# Patient Record
Sex: Male | Born: 1950
Health system: Southern US, Community
[De-identification: ages and names within clinical notes are randomized; demographics above are authoritative.]

## PROBLEM LIST (undated history)

## (undated) DIAGNOSIS — E119 Type 2 diabetes mellitus without complications: Secondary | ICD-10-CM

## (undated) DIAGNOSIS — R002 Palpitations: Secondary | ICD-10-CM

## (undated) DIAGNOSIS — E785 Hyperlipidemia, unspecified: Secondary | ICD-10-CM

## (undated) DIAGNOSIS — G4733 Obstructive sleep apnea (adult) (pediatric): Secondary | ICD-10-CM

## (undated) DIAGNOSIS — K219 Gastro-esophageal reflux disease without esophagitis: Secondary | ICD-10-CM

## (undated) DIAGNOSIS — K5790 Diverticulosis of intestine, part unspecified, without perforation or abscess without bleeding: Secondary | ICD-10-CM

## (undated) DIAGNOSIS — M109 Gout, unspecified: Secondary | ICD-10-CM

## (undated) DIAGNOSIS — C44721 Squamous cell carcinoma of skin of unspecified lower limb, including hip: Secondary | ICD-10-CM

## (undated) DIAGNOSIS — N189 Chronic kidney disease, unspecified: Secondary | ICD-10-CM

## (undated) DIAGNOSIS — I10 Essential (primary) hypertension: Secondary | ICD-10-CM

## (undated) HISTORY — DX: Type 2 diabetes mellitus without complications: E11.9

## (undated) HISTORY — DX: Obstructive sleep apnea (adult) (pediatric): G47.33

## (undated) HISTORY — DX: Palpitations: R00.2

## (undated) HISTORY — DX: Essential (primary) hypertension: I10

## (undated) HISTORY — DX: Gastro-esophageal reflux disease without esophagitis: K21.9

## (undated) HISTORY — DX: Squamous cell carcinoma of skin of unspecified lower limb, including hip: C44.721

## (undated) HISTORY — PX: TONSILLECTOMY: SUR1361

## (undated) HISTORY — DX: Diverticulosis of intestine, part unspecified, without perforation or abscess without bleeding: K57.90

## (undated) HISTORY — DX: Hyperlipidemia, unspecified: E78.5

## (undated) HISTORY — PX: APPENDECTOMY: SHX54

## (undated) HISTORY — PX: KNEE SURGERY: SHX244

## (undated) HISTORY — DX: Chronic kidney disease, unspecified: N18.9

---

## 2000-02-29 ENCOUNTER — Encounter: Payer: Self-pay | Admitting: Family Medicine

## 2000-02-29 ENCOUNTER — Encounter: Admission: RE | Admit: 2000-02-29 | Discharge: 2000-02-29 | Payer: Self-pay | Admitting: Family Medicine

## 2000-05-17 ENCOUNTER — Other Ambulatory Visit: Admission: RE | Admit: 2000-05-17 | Discharge: 2000-05-17 | Payer: Self-pay | Admitting: Internal Medicine

## 2001-06-28 ENCOUNTER — Encounter: Payer: Self-pay | Admitting: Family Medicine

## 2001-06-28 ENCOUNTER — Encounter: Admission: RE | Admit: 2001-06-28 | Discharge: 2001-06-28 | Payer: Self-pay | Admitting: Family Medicine

## 2003-01-15 ENCOUNTER — Encounter: Admission: RE | Admit: 2003-01-15 | Discharge: 2003-01-15 | Payer: Self-pay | Admitting: Family Medicine

## 2003-01-15 ENCOUNTER — Encounter: Payer: Self-pay | Admitting: Family Medicine

## 2015-11-01 DIAGNOSIS — N183 Chronic kidney disease, stage 3 (moderate): Secondary | ICD-10-CM | POA: Diagnosis not present

## 2015-11-01 DIAGNOSIS — E1122 Type 2 diabetes mellitus with diabetic chronic kidney disease: Secondary | ICD-10-CM | POA: Diagnosis not present

## 2015-11-01 DIAGNOSIS — Z794 Long term (current) use of insulin: Secondary | ICD-10-CM | POA: Diagnosis not present

## 2015-11-01 DIAGNOSIS — I10 Essential (primary) hypertension: Secondary | ICD-10-CM | POA: Diagnosis not present

## 2015-11-01 DIAGNOSIS — E785 Hyperlipidemia, unspecified: Secondary | ICD-10-CM | POA: Diagnosis not present

## 2015-11-18 DIAGNOSIS — M2040 Other hammer toe(s) (acquired), unspecified foot: Secondary | ICD-10-CM | POA: Diagnosis not present

## 2015-11-18 DIAGNOSIS — E1129 Type 2 diabetes mellitus with other diabetic kidney complication: Secondary | ICD-10-CM | POA: Diagnosis not present

## 2015-11-18 DIAGNOSIS — M7672 Peroneal tendinitis, left leg: Secondary | ICD-10-CM | POA: Diagnosis not present

## 2015-11-29 DIAGNOSIS — N183 Chronic kidney disease, stage 3 (moderate): Secondary | ICD-10-CM | POA: Diagnosis not present

## 2015-11-29 DIAGNOSIS — K219 Gastro-esophageal reflux disease without esophagitis: Secondary | ICD-10-CM | POA: Diagnosis not present

## 2015-11-29 DIAGNOSIS — I10 Essential (primary) hypertension: Secondary | ICD-10-CM | POA: Diagnosis not present

## 2015-11-29 DIAGNOSIS — R1314 Dysphagia, pharyngoesophageal phase: Secondary | ICD-10-CM | POA: Diagnosis not present

## 2015-11-29 DIAGNOSIS — E7800 Pure hypercholesterolemia, unspecified: Secondary | ICD-10-CM | POA: Insufficient documentation

## 2015-11-29 DIAGNOSIS — Z794 Long term (current) use of insulin: Secondary | ICD-10-CM | POA: Diagnosis not present

## 2015-11-29 DIAGNOSIS — E559 Vitamin D deficiency, unspecified: Secondary | ICD-10-CM | POA: Diagnosis not present

## 2015-11-29 DIAGNOSIS — E119 Type 2 diabetes mellitus without complications: Secondary | ICD-10-CM | POA: Diagnosis not present

## 2015-12-12 DIAGNOSIS — Z86010 Personal history of colonic polyps: Secondary | ICD-10-CM | POA: Insufficient documentation

## 2015-12-14 DIAGNOSIS — K219 Gastro-esophageal reflux disease without esophagitis: Secondary | ICD-10-CM | POA: Diagnosis not present

## 2015-12-14 DIAGNOSIS — Z86010 Personal history of colonic polyps: Secondary | ICD-10-CM | POA: Diagnosis not present

## 2015-12-14 DIAGNOSIS — R1314 Dysphagia, pharyngoesophageal phase: Secondary | ICD-10-CM | POA: Diagnosis not present

## 2015-12-30 DIAGNOSIS — R1314 Dysphagia, pharyngoesophageal phase: Secondary | ICD-10-CM | POA: Diagnosis not present

## 2015-12-30 DIAGNOSIS — K219 Gastro-esophageal reflux disease without esophagitis: Secondary | ICD-10-CM | POA: Diagnosis not present

## 2015-12-30 DIAGNOSIS — K209 Esophagitis, unspecified: Secondary | ICD-10-CM | POA: Diagnosis not present

## 2015-12-30 DIAGNOSIS — K2210 Ulcer of esophagus without bleeding: Secondary | ICD-10-CM | POA: Diagnosis not present

## 2015-12-30 DIAGNOSIS — K210 Gastro-esophageal reflux disease with esophagitis: Secondary | ICD-10-CM | POA: Diagnosis not present

## 2016-01-24 DIAGNOSIS — M7751 Other enthesopathy of right foot: Secondary | ICD-10-CM | POA: Diagnosis not present

## 2016-01-24 DIAGNOSIS — M7661 Achilles tendinitis, right leg: Secondary | ICD-10-CM | POA: Insufficient documentation

## 2016-01-24 DIAGNOSIS — M722 Plantar fascial fibromatosis: Secondary | ICD-10-CM | POA: Insufficient documentation

## 2016-01-24 DIAGNOSIS — M6701 Short Achilles tendon (acquired), right ankle: Secondary | ICD-10-CM | POA: Diagnosis not present

## 2016-02-08 DIAGNOSIS — M7751 Other enthesopathy of right foot: Secondary | ICD-10-CM | POA: Diagnosis not present

## 2016-02-08 DIAGNOSIS — M6701 Short Achilles tendon (acquired), right ankle: Secondary | ICD-10-CM | POA: Diagnosis not present

## 2016-02-08 DIAGNOSIS — M7661 Achilles tendinitis, right leg: Secondary | ICD-10-CM | POA: Diagnosis not present

## 2016-03-03 DIAGNOSIS — Z136 Encounter for screening for cardiovascular disorders: Secondary | ICD-10-CM | POA: Diagnosis not present

## 2016-03-03 DIAGNOSIS — Z23 Encounter for immunization: Secondary | ICD-10-CM | POA: Diagnosis not present

## 2016-03-03 DIAGNOSIS — E7800 Pure hypercholesterolemia, unspecified: Secondary | ICD-10-CM | POA: Diagnosis not present

## 2016-03-03 DIAGNOSIS — Z794 Long term (current) use of insulin: Secondary | ICD-10-CM | POA: Diagnosis not present

## 2016-03-03 DIAGNOSIS — E119 Type 2 diabetes mellitus without complications: Secondary | ICD-10-CM | POA: Diagnosis not present

## 2016-03-03 DIAGNOSIS — I10 Essential (primary) hypertension: Secondary | ICD-10-CM | POA: Diagnosis not present

## 2016-03-03 DIAGNOSIS — Z1159 Encounter for screening for other viral diseases: Secondary | ICD-10-CM | POA: Diagnosis not present

## 2016-03-17 DIAGNOSIS — K253 Acute gastric ulcer without hemorrhage or perforation: Secondary | ICD-10-CM | POA: Diagnosis not present

## 2016-03-17 DIAGNOSIS — K209 Esophagitis, unspecified: Secondary | ICD-10-CM | POA: Diagnosis not present

## 2016-03-17 DIAGNOSIS — R1310 Dysphagia, unspecified: Secondary | ICD-10-CM | POA: Diagnosis not present

## 2016-03-24 DIAGNOSIS — I129 Hypertensive chronic kidney disease with stage 1 through stage 4 chronic kidney disease, or unspecified chronic kidney disease: Secondary | ICD-10-CM | POA: Diagnosis not present

## 2016-03-24 DIAGNOSIS — N183 Chronic kidney disease, stage 3 unspecified: Secondary | ICD-10-CM | POA: Insufficient documentation

## 2016-03-24 DIAGNOSIS — E1165 Type 2 diabetes mellitus with hyperglycemia: Secondary | ICD-10-CM | POA: Diagnosis not present

## 2016-03-24 DIAGNOSIS — Z794 Long term (current) use of insulin: Secondary | ICD-10-CM | POA: Insufficient documentation

## 2016-03-24 DIAGNOSIS — E785 Hyperlipidemia, unspecified: Secondary | ICD-10-CM | POA: Diagnosis not present

## 2016-03-24 DIAGNOSIS — E1122 Type 2 diabetes mellitus with diabetic chronic kidney disease: Secondary | ICD-10-CM | POA: Diagnosis not present

## 2016-03-28 DIAGNOSIS — E875 Hyperkalemia: Secondary | ICD-10-CM | POA: Diagnosis not present

## 2016-05-18 DIAGNOSIS — M2041 Other hammer toe(s) (acquired), right foot: Secondary | ICD-10-CM | POA: Diagnosis not present

## 2016-05-18 DIAGNOSIS — M2042 Other hammer toe(s) (acquired), left foot: Secondary | ICD-10-CM | POA: Diagnosis not present

## 2016-05-18 DIAGNOSIS — M2040 Other hammer toe(s) (acquired), unspecified foot: Secondary | ICD-10-CM | POA: Insufficient documentation

## 2016-05-18 DIAGNOSIS — M722 Plantar fascial fibromatosis: Secondary | ICD-10-CM | POA: Diagnosis not present

## 2016-05-18 DIAGNOSIS — M6701 Short Achilles tendon (acquired), right ankle: Secondary | ICD-10-CM | POA: Diagnosis not present

## 2016-07-14 DIAGNOSIS — H5211 Myopia, right eye: Secondary | ICD-10-CM | POA: Diagnosis not present

## 2016-07-14 DIAGNOSIS — H35033 Hypertensive retinopathy, bilateral: Secondary | ICD-10-CM | POA: Diagnosis not present

## 2016-07-14 DIAGNOSIS — E119 Type 2 diabetes mellitus without complications: Secondary | ICD-10-CM | POA: Diagnosis not present

## 2016-07-14 DIAGNOSIS — H524 Presbyopia: Secondary | ICD-10-CM | POA: Diagnosis not present

## 2016-07-17 DIAGNOSIS — E11649 Type 2 diabetes mellitus with hypoglycemia without coma: Secondary | ICD-10-CM | POA: Diagnosis not present

## 2016-07-17 DIAGNOSIS — I129 Hypertensive chronic kidney disease with stage 1 through stage 4 chronic kidney disease, or unspecified chronic kidney disease: Secondary | ICD-10-CM | POA: Diagnosis not present

## 2016-07-17 DIAGNOSIS — Z794 Long term (current) use of insulin: Secondary | ICD-10-CM | POA: Diagnosis not present

## 2016-07-17 DIAGNOSIS — E1165 Type 2 diabetes mellitus with hyperglycemia: Secondary | ICD-10-CM | POA: Diagnosis not present

## 2016-07-17 DIAGNOSIS — E785 Hyperlipidemia, unspecified: Secondary | ICD-10-CM | POA: Diagnosis not present

## 2016-07-17 DIAGNOSIS — N183 Chronic kidney disease, stage 3 (moderate): Secondary | ICD-10-CM | POA: Diagnosis not present

## 2016-07-17 DIAGNOSIS — E1122 Type 2 diabetes mellitus with diabetic chronic kidney disease: Secondary | ICD-10-CM | POA: Diagnosis not present

## 2016-09-04 DIAGNOSIS — E7800 Pure hypercholesterolemia, unspecified: Secondary | ICD-10-CM | POA: Diagnosis not present

## 2016-09-04 DIAGNOSIS — N183 Chronic kidney disease, stage 3 (moderate): Secondary | ICD-10-CM | POA: Diagnosis not present

## 2016-09-04 DIAGNOSIS — I10 Essential (primary) hypertension: Secondary | ICD-10-CM | POA: Diagnosis not present

## 2016-09-04 DIAGNOSIS — E119 Type 2 diabetes mellitus without complications: Secondary | ICD-10-CM | POA: Diagnosis not present

## 2016-09-04 DIAGNOSIS — Z794 Long term (current) use of insulin: Secondary | ICD-10-CM | POA: Diagnosis not present

## 2016-11-20 DIAGNOSIS — N183 Chronic kidney disease, stage 3 (moderate): Secondary | ICD-10-CM | POA: Diagnosis not present

## 2016-11-20 DIAGNOSIS — Z794 Long term (current) use of insulin: Secondary | ICD-10-CM | POA: Diagnosis not present

## 2016-11-20 DIAGNOSIS — I129 Hypertensive chronic kidney disease with stage 1 through stage 4 chronic kidney disease, or unspecified chronic kidney disease: Secondary | ICD-10-CM | POA: Diagnosis not present

## 2016-11-20 DIAGNOSIS — E785 Hyperlipidemia, unspecified: Secondary | ICD-10-CM | POA: Diagnosis not present

## 2016-11-20 DIAGNOSIS — E1122 Type 2 diabetes mellitus with diabetic chronic kidney disease: Secondary | ICD-10-CM | POA: Diagnosis not present

## 2016-11-23 DIAGNOSIS — M2042 Other hammer toe(s) (acquired), left foot: Secondary | ICD-10-CM | POA: Diagnosis not present

## 2016-11-23 DIAGNOSIS — N183 Chronic kidney disease, stage 3 (moderate): Secondary | ICD-10-CM | POA: Diagnosis not present

## 2016-11-23 DIAGNOSIS — M2041 Other hammer toe(s) (acquired), right foot: Secondary | ICD-10-CM | POA: Diagnosis not present

## 2016-11-23 DIAGNOSIS — E1122 Type 2 diabetes mellitus with diabetic chronic kidney disease: Secondary | ICD-10-CM | POA: Diagnosis not present

## 2016-11-23 DIAGNOSIS — M6701 Short Achilles tendon (acquired), right ankle: Secondary | ICD-10-CM | POA: Diagnosis not present

## 2017-03-07 DIAGNOSIS — E7800 Pure hypercholesterolemia, unspecified: Secondary | ICD-10-CM | POA: Diagnosis not present

## 2017-03-07 DIAGNOSIS — I10 Essential (primary) hypertension: Secondary | ICD-10-CM | POA: Diagnosis not present

## 2017-03-07 DIAGNOSIS — K219 Gastro-esophageal reflux disease without esophagitis: Secondary | ICD-10-CM | POA: Diagnosis not present

## 2017-03-07 DIAGNOSIS — E119 Type 2 diabetes mellitus without complications: Secondary | ICD-10-CM | POA: Diagnosis not present

## 2017-03-07 DIAGNOSIS — N183 Chronic kidney disease, stage 3 (moderate): Secondary | ICD-10-CM | POA: Diagnosis not present

## 2017-03-07 DIAGNOSIS — Z136 Encounter for screening for cardiovascular disorders: Secondary | ICD-10-CM | POA: Diagnosis not present

## 2017-03-07 DIAGNOSIS — Z794 Long term (current) use of insulin: Secondary | ICD-10-CM | POA: Diagnosis not present

## 2017-03-07 DIAGNOSIS — L729 Follicular cyst of the skin and subcutaneous tissue, unspecified: Secondary | ICD-10-CM | POA: Diagnosis not present

## 2017-03-09 DIAGNOSIS — Z136 Encounter for screening for cardiovascular disorders: Secondary | ICD-10-CM | POA: Diagnosis not present

## 2017-03-09 DIAGNOSIS — I712 Thoracic aortic aneurysm, without rupture: Secondary | ICD-10-CM | POA: Diagnosis not present

## 2017-03-23 DIAGNOSIS — E785 Hyperlipidemia, unspecified: Secondary | ICD-10-CM | POA: Diagnosis not present

## 2017-03-23 DIAGNOSIS — N183 Chronic kidney disease, stage 3 (moderate): Secondary | ICD-10-CM | POA: Diagnosis not present

## 2017-03-23 DIAGNOSIS — I129 Hypertensive chronic kidney disease with stage 1 through stage 4 chronic kidney disease, or unspecified chronic kidney disease: Secondary | ICD-10-CM | POA: Diagnosis not present

## 2017-03-23 DIAGNOSIS — Z794 Long term (current) use of insulin: Secondary | ICD-10-CM | POA: Diagnosis not present

## 2017-03-23 DIAGNOSIS — E1122 Type 2 diabetes mellitus with diabetic chronic kidney disease: Secondary | ICD-10-CM | POA: Diagnosis not present

## 2017-03-30 DIAGNOSIS — Z1212 Encounter for screening for malignant neoplasm of rectum: Secondary | ICD-10-CM | POA: Diagnosis not present

## 2017-03-30 DIAGNOSIS — Z1211 Encounter for screening for malignant neoplasm of colon: Secondary | ICD-10-CM | POA: Diagnosis not present

## 2017-04-20 DIAGNOSIS — L720 Epidermal cyst: Secondary | ICD-10-CM | POA: Diagnosis not present

## 2017-06-04 DIAGNOSIS — N183 Chronic kidney disease, stage 3 (moderate): Secondary | ICD-10-CM | POA: Diagnosis not present

## 2017-06-04 DIAGNOSIS — E1122 Type 2 diabetes mellitus with diabetic chronic kidney disease: Secondary | ICD-10-CM | POA: Diagnosis not present

## 2017-06-04 DIAGNOSIS — H60393 Other infective otitis externa, bilateral: Secondary | ICD-10-CM | POA: Diagnosis not present

## 2017-06-04 DIAGNOSIS — I10 Essential (primary) hypertension: Secondary | ICD-10-CM | POA: Diagnosis not present

## 2017-06-04 DIAGNOSIS — Z794 Long term (current) use of insulin: Secondary | ICD-10-CM | POA: Diagnosis not present

## 2017-07-24 DIAGNOSIS — N183 Chronic kidney disease, stage 3 (moderate): Secondary | ICD-10-CM | POA: Diagnosis not present

## 2017-07-24 DIAGNOSIS — I129 Hypertensive chronic kidney disease with stage 1 through stage 4 chronic kidney disease, or unspecified chronic kidney disease: Secondary | ICD-10-CM | POA: Diagnosis not present

## 2017-07-24 DIAGNOSIS — E785 Hyperlipidemia, unspecified: Secondary | ICD-10-CM | POA: Diagnosis not present

## 2017-07-24 DIAGNOSIS — E1122 Type 2 diabetes mellitus with diabetic chronic kidney disease: Secondary | ICD-10-CM | POA: Diagnosis not present

## 2017-08-28 DIAGNOSIS — N183 Chronic kidney disease, stage 3 (moderate): Secondary | ICD-10-CM | POA: Diagnosis not present

## 2017-08-28 DIAGNOSIS — E7800 Pure hypercholesterolemia, unspecified: Secondary | ICD-10-CM | POA: Diagnosis not present

## 2017-08-28 DIAGNOSIS — Z794 Long term (current) use of insulin: Secondary | ICD-10-CM | POA: Diagnosis not present

## 2017-08-28 DIAGNOSIS — I129 Hypertensive chronic kidney disease with stage 1 through stage 4 chronic kidney disease, or unspecified chronic kidney disease: Secondary | ICD-10-CM | POA: Diagnosis not present

## 2017-08-28 DIAGNOSIS — E1122 Type 2 diabetes mellitus with diabetic chronic kidney disease: Secondary | ICD-10-CM | POA: Diagnosis not present

## 2017-08-28 DIAGNOSIS — E119 Type 2 diabetes mellitus without complications: Secondary | ICD-10-CM | POA: Diagnosis not present

## 2017-12-03 DIAGNOSIS — I129 Hypertensive chronic kidney disease with stage 1 through stage 4 chronic kidney disease, or unspecified chronic kidney disease: Secondary | ICD-10-CM | POA: Diagnosis not present

## 2017-12-03 DIAGNOSIS — E785 Hyperlipidemia, unspecified: Secondary | ICD-10-CM | POA: Diagnosis not present

## 2017-12-03 DIAGNOSIS — N183 Chronic kidney disease, stage 3 (moderate): Secondary | ICD-10-CM | POA: Diagnosis not present

## 2017-12-03 DIAGNOSIS — E1122 Type 2 diabetes mellitus with diabetic chronic kidney disease: Secondary | ICD-10-CM | POA: Diagnosis not present

## 2017-12-03 DIAGNOSIS — Z794 Long term (current) use of insulin: Secondary | ICD-10-CM | POA: Diagnosis not present

## 2018-01-07 DIAGNOSIS — K210 Gastro-esophageal reflux disease with esophagitis: Secondary | ICD-10-CM | POA: Diagnosis not present

## 2018-01-07 DIAGNOSIS — R5383 Other fatigue: Secondary | ICD-10-CM | POA: Diagnosis not present

## 2018-01-07 DIAGNOSIS — K219 Gastro-esophageal reflux disease without esophagitis: Secondary | ICD-10-CM | POA: Diagnosis not present

## 2018-01-07 DIAGNOSIS — J302 Other seasonal allergic rhinitis: Secondary | ICD-10-CM | POA: Diagnosis not present

## 2018-01-07 DIAGNOSIS — I10 Essential (primary) hypertension: Secondary | ICD-10-CM | POA: Diagnosis not present

## 2018-01-09 DIAGNOSIS — E538 Deficiency of other specified B group vitamins: Secondary | ICD-10-CM | POA: Diagnosis not present

## 2018-01-10 DIAGNOSIS — E538 Deficiency of other specified B group vitamins: Secondary | ICD-10-CM | POA: Diagnosis not present

## 2018-01-11 DIAGNOSIS — E538 Deficiency of other specified B group vitamins: Secondary | ICD-10-CM | POA: Diagnosis not present

## 2018-01-14 DIAGNOSIS — E538 Deficiency of other specified B group vitamins: Secondary | ICD-10-CM | POA: Diagnosis not present

## 2018-01-15 DIAGNOSIS — E538 Deficiency of other specified B group vitamins: Secondary | ICD-10-CM | POA: Diagnosis not present

## 2018-01-16 DIAGNOSIS — E538 Deficiency of other specified B group vitamins: Secondary | ICD-10-CM | POA: Diagnosis not present

## 2018-01-16 DIAGNOSIS — R7989 Other specified abnormal findings of blood chemistry: Secondary | ICD-10-CM | POA: Diagnosis not present

## 2018-01-17 DIAGNOSIS — E538 Deficiency of other specified B group vitamins: Secondary | ICD-10-CM | POA: Diagnosis not present

## 2018-01-24 DIAGNOSIS — E538 Deficiency of other specified B group vitamins: Secondary | ICD-10-CM | POA: Diagnosis not present

## 2018-01-31 DIAGNOSIS — E538 Deficiency of other specified B group vitamins: Secondary | ICD-10-CM | POA: Diagnosis not present

## 2018-02-07 DIAGNOSIS — E539 Vitamin B deficiency, unspecified: Secondary | ICD-10-CM | POA: Diagnosis not present

## 2018-02-14 DIAGNOSIS — E538 Deficiency of other specified B group vitamins: Secondary | ICD-10-CM | POA: Diagnosis not present

## 2018-03-01 DIAGNOSIS — E538 Deficiency of other specified B group vitamins: Secondary | ICD-10-CM | POA: Diagnosis not present

## 2018-03-01 DIAGNOSIS — I10 Essential (primary) hypertension: Secondary | ICD-10-CM | POA: Diagnosis not present

## 2018-03-01 DIAGNOSIS — E7800 Pure hypercholesterolemia, unspecified: Secondary | ICD-10-CM | POA: Diagnosis not present

## 2018-03-01 DIAGNOSIS — E119 Type 2 diabetes mellitus without complications: Secondary | ICD-10-CM | POA: Diagnosis not present

## 2018-03-05 DIAGNOSIS — E7800 Pure hypercholesterolemia, unspecified: Secondary | ICD-10-CM | POA: Diagnosis not present

## 2018-03-05 DIAGNOSIS — N183 Chronic kidney disease, stage 3 (moderate): Secondary | ICD-10-CM | POA: Diagnosis not present

## 2018-03-05 DIAGNOSIS — E1122 Type 2 diabetes mellitus with diabetic chronic kidney disease: Secondary | ICD-10-CM | POA: Diagnosis not present

## 2018-03-05 DIAGNOSIS — I129 Hypertensive chronic kidney disease with stage 1 through stage 4 chronic kidney disease, or unspecified chronic kidney disease: Secondary | ICD-10-CM | POA: Diagnosis not present

## 2018-03-18 DIAGNOSIS — E538 Deficiency of other specified B group vitamins: Secondary | ICD-10-CM | POA: Diagnosis not present

## 2018-04-17 DIAGNOSIS — E538 Deficiency of other specified B group vitamins: Secondary | ICD-10-CM | POA: Diagnosis not present

## 2018-05-21 DIAGNOSIS — E538 Deficiency of other specified B group vitamins: Secondary | ICD-10-CM | POA: Diagnosis not present

## 2018-06-21 DIAGNOSIS — E538 Deficiency of other specified B group vitamins: Secondary | ICD-10-CM | POA: Diagnosis not present

## 2018-07-22 DIAGNOSIS — Z23 Encounter for immunization: Secondary | ICD-10-CM | POA: Diagnosis not present

## 2018-07-22 DIAGNOSIS — E538 Deficiency of other specified B group vitamins: Secondary | ICD-10-CM | POA: Diagnosis not present

## 2018-07-23 DIAGNOSIS — Z6830 Body mass index (BMI) 30.0-30.9, adult: Secondary | ICD-10-CM | POA: Diagnosis not present

## 2018-07-23 DIAGNOSIS — E1165 Type 2 diabetes mellitus with hyperglycemia: Secondary | ICD-10-CM | POA: Diagnosis not present

## 2018-07-23 DIAGNOSIS — I499 Cardiac arrhythmia, unspecified: Secondary | ICD-10-CM | POA: Diagnosis not present

## 2018-07-23 DIAGNOSIS — E538 Deficiency of other specified B group vitamins: Secondary | ICD-10-CM | POA: Diagnosis not present

## 2018-07-26 DIAGNOSIS — R9431 Abnormal electrocardiogram [ECG] [EKG]: Secondary | ICD-10-CM | POA: Diagnosis not present

## 2018-07-26 DIAGNOSIS — E538 Deficiency of other specified B group vitamins: Secondary | ICD-10-CM | POA: Diagnosis not present

## 2018-07-26 DIAGNOSIS — I10 Essential (primary) hypertension: Secondary | ICD-10-CM | POA: Diagnosis not present

## 2018-07-26 DIAGNOSIS — Z794 Long term (current) use of insulin: Secondary | ICD-10-CM | POA: Diagnosis not present

## 2018-07-26 DIAGNOSIS — E119 Type 2 diabetes mellitus without complications: Secondary | ICD-10-CM | POA: Diagnosis not present

## 2018-07-26 DIAGNOSIS — R002 Palpitations: Secondary | ICD-10-CM | POA: Diagnosis not present

## 2018-08-06 DIAGNOSIS — E1122 Type 2 diabetes mellitus with diabetic chronic kidney disease: Secondary | ICD-10-CM | POA: Diagnosis not present

## 2018-08-06 DIAGNOSIS — E782 Mixed hyperlipidemia: Secondary | ICD-10-CM | POA: Diagnosis not present

## 2018-08-06 DIAGNOSIS — N183 Chronic kidney disease, stage 3 (moderate): Secondary | ICD-10-CM | POA: Diagnosis not present

## 2018-08-06 DIAGNOSIS — R0609 Other forms of dyspnea: Secondary | ICD-10-CM | POA: Diagnosis not present

## 2018-08-06 DIAGNOSIS — R002 Palpitations: Secondary | ICD-10-CM | POA: Diagnosis not present

## 2018-08-06 DIAGNOSIS — I129 Hypertensive chronic kidney disease with stage 1 through stage 4 chronic kidney disease, or unspecified chronic kidney disease: Secondary | ICD-10-CM | POA: Diagnosis not present

## 2018-08-06 DIAGNOSIS — Z794 Long term (current) use of insulin: Secondary | ICD-10-CM | POA: Diagnosis not present

## 2018-08-06 DIAGNOSIS — R9431 Abnormal electrocardiogram [ECG] [EKG]: Secondary | ICD-10-CM | POA: Diagnosis not present

## 2018-08-14 DIAGNOSIS — R0609 Other forms of dyspnea: Secondary | ICD-10-CM | POA: Diagnosis not present

## 2018-08-14 DIAGNOSIS — R9431 Abnormal electrocardiogram [ECG] [EKG]: Secondary | ICD-10-CM | POA: Diagnosis not present

## 2018-08-22 DIAGNOSIS — E538 Deficiency of other specified B group vitamins: Secondary | ICD-10-CM | POA: Diagnosis not present

## 2018-09-06 DIAGNOSIS — E7800 Pure hypercholesterolemia, unspecified: Secondary | ICD-10-CM | POA: Diagnosis not present

## 2018-09-06 DIAGNOSIS — R002 Palpitations: Secondary | ICD-10-CM | POA: Diagnosis not present

## 2018-09-06 DIAGNOSIS — E1122 Type 2 diabetes mellitus with diabetic chronic kidney disease: Secondary | ICD-10-CM | POA: Diagnosis not present

## 2018-09-06 DIAGNOSIS — Z125 Encounter for screening for malignant neoplasm of prostate: Secondary | ICD-10-CM | POA: Diagnosis not present

## 2018-09-06 DIAGNOSIS — E538 Deficiency of other specified B group vitamins: Secondary | ICD-10-CM | POA: Diagnosis not present

## 2018-09-06 DIAGNOSIS — I129 Hypertensive chronic kidney disease with stage 1 through stage 4 chronic kidney disease, or unspecified chronic kidney disease: Secondary | ICD-10-CM | POA: Diagnosis not present

## 2018-09-06 DIAGNOSIS — Z87891 Personal history of nicotine dependence: Secondary | ICD-10-CM | POA: Diagnosis not present

## 2018-09-06 DIAGNOSIS — Z794 Long term (current) use of insulin: Secondary | ICD-10-CM | POA: Diagnosis not present

## 2018-09-06 DIAGNOSIS — K210 Gastro-esophageal reflux disease with esophagitis: Secondary | ICD-10-CM | POA: Diagnosis not present

## 2018-09-06 DIAGNOSIS — Z0000 Encounter for general adult medical examination without abnormal findings: Secondary | ICD-10-CM | POA: Diagnosis not present

## 2018-09-06 DIAGNOSIS — N183 Chronic kidney disease, stage 3 (moderate): Secondary | ICD-10-CM | POA: Diagnosis not present

## 2018-09-20 DIAGNOSIS — E538 Deficiency of other specified B group vitamins: Secondary | ICD-10-CM | POA: Diagnosis not present

## 2018-10-21 DIAGNOSIS — E538 Deficiency of other specified B group vitamins: Secondary | ICD-10-CM | POA: Diagnosis not present

## 2018-10-24 DIAGNOSIS — Z6830 Body mass index (BMI) 30.0-30.9, adult: Secondary | ICD-10-CM | POA: Diagnosis not present

## 2018-10-24 DIAGNOSIS — N183 Chronic kidney disease, stage 3 (moderate): Secondary | ICD-10-CM | POA: Diagnosis not present

## 2018-10-24 DIAGNOSIS — E538 Deficiency of other specified B group vitamins: Secondary | ICD-10-CM | POA: Diagnosis not present

## 2018-10-24 DIAGNOSIS — I499 Cardiac arrhythmia, unspecified: Secondary | ICD-10-CM | POA: Diagnosis not present

## 2018-11-12 DIAGNOSIS — N183 Chronic kidney disease, stage 3 (moderate): Secondary | ICD-10-CM | POA: Diagnosis not present

## 2018-11-12 DIAGNOSIS — I129 Hypertensive chronic kidney disease with stage 1 through stage 4 chronic kidney disease, or unspecified chronic kidney disease: Secondary | ICD-10-CM | POA: Diagnosis not present

## 2018-11-12 DIAGNOSIS — E1122 Type 2 diabetes mellitus with diabetic chronic kidney disease: Secondary | ICD-10-CM | POA: Diagnosis not present

## 2018-11-12 DIAGNOSIS — E782 Mixed hyperlipidemia: Secondary | ICD-10-CM | POA: Diagnosis not present

## 2018-11-12 DIAGNOSIS — Z794 Long term (current) use of insulin: Secondary | ICD-10-CM | POA: Diagnosis not present

## 2018-11-12 DIAGNOSIS — R002 Palpitations: Secondary | ICD-10-CM | POA: Diagnosis not present

## 2018-11-12 DIAGNOSIS — R0609 Other forms of dyspnea: Secondary | ICD-10-CM | POA: Diagnosis not present

## 2018-11-21 DIAGNOSIS — E538 Deficiency of other specified B group vitamins: Secondary | ICD-10-CM | POA: Diagnosis not present

## 2018-12-05 DIAGNOSIS — E1165 Type 2 diabetes mellitus with hyperglycemia: Secondary | ICD-10-CM | POA: Diagnosis not present

## 2018-12-05 DIAGNOSIS — I499 Cardiac arrhythmia, unspecified: Secondary | ICD-10-CM | POA: Diagnosis not present

## 2018-12-05 DIAGNOSIS — N183 Chronic kidney disease, stage 3 (moderate): Secondary | ICD-10-CM | POA: Diagnosis not present

## 2018-12-05 DIAGNOSIS — I10 Essential (primary) hypertension: Secondary | ICD-10-CM | POA: Diagnosis not present

## 2018-12-10 DIAGNOSIS — M7672 Peroneal tendinitis, left leg: Secondary | ICD-10-CM | POA: Diagnosis not present

## 2018-12-20 DIAGNOSIS — E538 Deficiency of other specified B group vitamins: Secondary | ICD-10-CM | POA: Diagnosis not present

## 2019-02-13 DIAGNOSIS — Z9989 Dependence on other enabling machines and devices: Secondary | ICD-10-CM | POA: Diagnosis not present

## 2019-02-13 DIAGNOSIS — G4733 Obstructive sleep apnea (adult) (pediatric): Secondary | ICD-10-CM | POA: Diagnosis not present

## 2019-02-26 DIAGNOSIS — Z794 Long term (current) use of insulin: Secondary | ICD-10-CM | POA: Diagnosis not present

## 2019-02-26 DIAGNOSIS — E1122 Type 2 diabetes mellitus with diabetic chronic kidney disease: Secondary | ICD-10-CM | POA: Diagnosis not present

## 2019-02-26 DIAGNOSIS — N183 Chronic kidney disease, stage 3 (moderate): Secondary | ICD-10-CM | POA: Diagnosis not present

## 2019-02-26 DIAGNOSIS — E7800 Pure hypercholesterolemia, unspecified: Secondary | ICD-10-CM | POA: Diagnosis not present

## 2019-02-26 DIAGNOSIS — I129 Hypertensive chronic kidney disease with stage 1 through stage 4 chronic kidney disease, or unspecified chronic kidney disease: Secondary | ICD-10-CM | POA: Diagnosis not present

## 2019-02-26 DIAGNOSIS — E538 Deficiency of other specified B group vitamins: Secondary | ICD-10-CM | POA: Diagnosis not present

## 2019-03-06 DIAGNOSIS — E1165 Type 2 diabetes mellitus with hyperglycemia: Secondary | ICD-10-CM | POA: Diagnosis not present

## 2019-03-06 DIAGNOSIS — I499 Cardiac arrhythmia, unspecified: Secondary | ICD-10-CM | POA: Diagnosis not present

## 2019-03-06 DIAGNOSIS — I10 Essential (primary) hypertension: Secondary | ICD-10-CM | POA: Diagnosis not present

## 2019-03-06 DIAGNOSIS — N183 Chronic kidney disease, stage 3 (moderate): Secondary | ICD-10-CM | POA: Diagnosis not present

## 2019-04-16 DIAGNOSIS — G4733 Obstructive sleep apnea (adult) (pediatric): Secondary | ICD-10-CM | POA: Diagnosis not present

## 2019-04-22 DIAGNOSIS — H6981 Other specified disorders of Eustachian tube, right ear: Secondary | ICD-10-CM | POA: Diagnosis not present

## 2019-04-22 DIAGNOSIS — R600 Localized edema: Secondary | ICD-10-CM | POA: Diagnosis not present

## 2019-05-06 DIAGNOSIS — Z87891 Personal history of nicotine dependence: Secondary | ICD-10-CM | POA: Diagnosis not present

## 2019-05-06 DIAGNOSIS — R600 Localized edema: Secondary | ICD-10-CM | POA: Diagnosis not present

## 2019-05-06 DIAGNOSIS — I10 Essential (primary) hypertension: Secondary | ICD-10-CM | POA: Diagnosis not present

## 2019-05-23 DIAGNOSIS — E538 Deficiency of other specified B group vitamins: Secondary | ICD-10-CM | POA: Diagnosis not present

## 2019-06-02 DIAGNOSIS — G4733 Obstructive sleep apnea (adult) (pediatric): Secondary | ICD-10-CM | POA: Diagnosis not present

## 2019-06-24 DIAGNOSIS — E538 Deficiency of other specified B group vitamins: Secondary | ICD-10-CM | POA: Diagnosis not present

## 2019-06-30 DIAGNOSIS — E669 Obesity, unspecified: Secondary | ICD-10-CM | POA: Insufficient documentation

## 2019-06-30 DIAGNOSIS — Z6836 Body mass index (BMI) 36.0-36.9, adult: Secondary | ICD-10-CM | POA: Diagnosis not present

## 2019-06-30 DIAGNOSIS — G4733 Obstructive sleep apnea (adult) (pediatric): Secondary | ICD-10-CM | POA: Diagnosis not present

## 2019-07-03 DIAGNOSIS — G4733 Obstructive sleep apnea (adult) (pediatric): Secondary | ICD-10-CM | POA: Diagnosis not present

## 2019-07-07 DIAGNOSIS — N183 Chronic kidney disease, stage 3 (moderate): Secondary | ICD-10-CM | POA: Diagnosis not present

## 2019-07-07 DIAGNOSIS — Z794 Long term (current) use of insulin: Secondary | ICD-10-CM | POA: Diagnosis not present

## 2019-07-07 DIAGNOSIS — H9201 Otalgia, right ear: Secondary | ICD-10-CM | POA: Diagnosis not present

## 2019-07-07 DIAGNOSIS — E1122 Type 2 diabetes mellitus with diabetic chronic kidney disease: Secondary | ICD-10-CM | POA: Diagnosis not present

## 2019-07-14 DIAGNOSIS — H608X2 Other otitis externa, left ear: Secondary | ICD-10-CM | POA: Insufficient documentation

## 2019-07-14 DIAGNOSIS — H608X3 Other otitis externa, bilateral: Secondary | ICD-10-CM | POA: Diagnosis not present

## 2019-07-17 DIAGNOSIS — E1121 Type 2 diabetes mellitus with diabetic nephropathy: Secondary | ICD-10-CM | POA: Diagnosis not present

## 2019-07-17 DIAGNOSIS — N1831 Chronic kidney disease, stage 3a: Secondary | ICD-10-CM | POA: Diagnosis not present

## 2019-07-17 DIAGNOSIS — Z794 Long term (current) use of insulin: Secondary | ICD-10-CM | POA: Diagnosis not present

## 2019-07-17 DIAGNOSIS — M67431 Ganglion, right wrist: Secondary | ICD-10-CM | POA: Diagnosis not present

## 2019-07-17 DIAGNOSIS — E7800 Pure hypercholesterolemia, unspecified: Secondary | ICD-10-CM | POA: Diagnosis not present

## 2019-07-17 DIAGNOSIS — I129 Hypertensive chronic kidney disease with stage 1 through stage 4 chronic kidney disease, or unspecified chronic kidney disease: Secondary | ICD-10-CM | POA: Diagnosis not present

## 2019-07-17 DIAGNOSIS — Z87891 Personal history of nicotine dependence: Secondary | ICD-10-CM | POA: Diagnosis not present

## 2019-07-17 DIAGNOSIS — F1010 Alcohol abuse, uncomplicated: Secondary | ICD-10-CM | POA: Diagnosis not present

## 2019-07-18 DIAGNOSIS — Z23 Encounter for immunization: Secondary | ICD-10-CM | POA: Diagnosis not present

## 2019-07-18 DIAGNOSIS — E1121 Type 2 diabetes mellitus with diabetic nephropathy: Secondary | ICD-10-CM | POA: Diagnosis not present

## 2019-07-18 DIAGNOSIS — N1831 Chronic kidney disease, stage 3a: Secondary | ICD-10-CM | POA: Diagnosis not present

## 2019-07-18 DIAGNOSIS — E7800 Pure hypercholesterolemia, unspecified: Secondary | ICD-10-CM | POA: Diagnosis not present

## 2019-07-18 DIAGNOSIS — Z794 Long term (current) use of insulin: Secondary | ICD-10-CM | POA: Diagnosis not present

## 2019-07-24 DIAGNOSIS — E538 Deficiency of other specified B group vitamins: Secondary | ICD-10-CM | POA: Diagnosis not present

## 2019-07-29 DIAGNOSIS — N183 Chronic kidney disease, stage 3 unspecified: Secondary | ICD-10-CM | POA: Diagnosis not present

## 2019-07-29 DIAGNOSIS — E1165 Type 2 diabetes mellitus with hyperglycemia: Secondary | ICD-10-CM | POA: Diagnosis not present

## 2019-07-29 DIAGNOSIS — I10 Essential (primary) hypertension: Secondary | ICD-10-CM | POA: Diagnosis not present

## 2019-07-29 DIAGNOSIS — I499 Cardiac arrhythmia, unspecified: Secondary | ICD-10-CM | POA: Diagnosis not present

## 2019-08-02 DIAGNOSIS — G4733 Obstructive sleep apnea (adult) (pediatric): Secondary | ICD-10-CM | POA: Diagnosis not present

## 2019-08-25 DIAGNOSIS — E538 Deficiency of other specified B group vitamins: Secondary | ICD-10-CM | POA: Diagnosis not present

## 2019-09-02 DIAGNOSIS — G4733 Obstructive sleep apnea (adult) (pediatric): Secondary | ICD-10-CM | POA: Diagnosis not present

## 2019-09-09 DIAGNOSIS — H60312 Diffuse otitis externa, left ear: Secondary | ICD-10-CM | POA: Diagnosis not present

## 2019-09-15 DIAGNOSIS — H608X2 Other otitis externa, left ear: Secondary | ICD-10-CM | POA: Diagnosis not present

## 2019-09-22 DIAGNOSIS — H608X2 Other otitis externa, left ear: Secondary | ICD-10-CM | POA: Diagnosis not present

## 2019-09-22 DIAGNOSIS — H60312 Diffuse otitis externa, left ear: Secondary | ICD-10-CM | POA: Diagnosis not present

## 2019-09-24 DIAGNOSIS — E538 Deficiency of other specified B group vitamins: Secondary | ICD-10-CM | POA: Diagnosis not present

## 2019-09-29 DIAGNOSIS — H608X2 Other otitis externa, left ear: Secondary | ICD-10-CM | POA: Diagnosis not present

## 2019-09-29 DIAGNOSIS — H60312 Diffuse otitis externa, left ear: Secondary | ICD-10-CM | POA: Diagnosis not present

## 2019-10-02 DIAGNOSIS — G4733 Obstructive sleep apnea (adult) (pediatric): Secondary | ICD-10-CM | POA: Diagnosis not present

## 2019-10-07 DIAGNOSIS — H60312 Diffuse otitis externa, left ear: Secondary | ICD-10-CM | POA: Diagnosis not present

## 2019-10-07 DIAGNOSIS — H608X2 Other otitis externa, left ear: Secondary | ICD-10-CM | POA: Diagnosis not present

## 2019-10-27 DIAGNOSIS — E538 Deficiency of other specified B group vitamins: Secondary | ICD-10-CM | POA: Diagnosis not present

## 2019-10-29 DIAGNOSIS — I10 Essential (primary) hypertension: Secondary | ICD-10-CM | POA: Diagnosis not present

## 2019-10-29 DIAGNOSIS — E1165 Type 2 diabetes mellitus with hyperglycemia: Secondary | ICD-10-CM | POA: Diagnosis not present

## 2019-10-29 DIAGNOSIS — I499 Cardiac arrhythmia, unspecified: Secondary | ICD-10-CM | POA: Diagnosis not present

## 2019-10-29 DIAGNOSIS — N183 Chronic kidney disease, stage 3 unspecified: Secondary | ICD-10-CM | POA: Diagnosis not present

## 2019-10-30 DIAGNOSIS — E669 Obesity, unspecified: Secondary | ICD-10-CM | POA: Diagnosis not present

## 2019-10-30 DIAGNOSIS — G4733 Obstructive sleep apnea (adult) (pediatric): Secondary | ICD-10-CM | POA: Diagnosis not present

## 2019-11-02 DIAGNOSIS — G4733 Obstructive sleep apnea (adult) (pediatric): Secondary | ICD-10-CM | POA: Diagnosis not present

## 2019-11-04 ENCOUNTER — Ambulatory Visit (INDEPENDENT_AMBULATORY_CARE_PROVIDER_SITE_OTHER): Payer: PPO | Admitting: Otolaryngology

## 2019-11-04 ENCOUNTER — Other Ambulatory Visit: Payer: Self-pay

## 2019-11-04 DIAGNOSIS — H6982 Other specified disorders of Eustachian tube, left ear: Secondary | ICD-10-CM

## 2019-11-04 DIAGNOSIS — H60312 Diffuse otitis externa, left ear: Secondary | ICD-10-CM | POA: Diagnosis not present

## 2019-11-04 NOTE — Progress Notes (Signed)
HPI: Tim Finley is a 69 y.o. male who presents is referred by his PCP for evaluation of left ear complaints.  Patient has history of insulin-dependent diabetes as well as obstructive sleep apnea and uses nasal CPAP regularly at night.  More recently starting a couple months ago he was having chronic itching in his ears and was seen by Dr. Laurance Flatten over in [facility].  He was initially treated with mometasone 0.1% steroid cream for the itching as well as Cortisporin eardrops twice daily.  He was subsequently diagnosed with fungus and started on Chlortrimazole 1% in the ear canal. The itching is doing better however he still feels congestion in the ear. He was recently started on Flonase.. Although he feels congestion in the ear he really does not notice that much hearing loss.  No past medical history on file.  Social History   Socioeconomic History  . Marital status: Married    Spouse name: Not on file  . Number of children: Not on file  . Years of education: Not on file  . Highest education level: Not on file  Occupational History  . Not on file  Tobacco Use  . Smoking status: Not on file  Substance and Sexual Activity  . Alcohol use: Not on file  . Drug use: Not on file  . Sexual activity: Not on file  Other Topics Concern  . Not on file  Social History Narrative  . Not on file   Social Determinants of Health   Financial Resource Strain:   . Difficulty of Paying Living Expenses: Not on file  Food Insecurity:   . Worried About Charity fundraiser in the Last Year: Not on file  . Ran Out of Food in the Last Year: Not on file  Transportation Needs:   . Lack of Transportation (Medical): Not on file  . Lack of Transportation (Non-Medical): Not on file  Physical Activity:   . Days of Exercise per Week: Not on file  . Minutes of Exercise per Session: Not on file  Stress:   . Feeling of Stress : Not on file  Social Connections:   . Frequency of Communication with Friends and  Family: Not on file  . Frequency of Social Gatherings with Friends and Family: Not on file  . Attends Religious Services: Not on file  . Active Member of Clubs or Organizations: Not on file  . Attends Archivist Meetings: Not on file  . Marital Status: Not on file   No family history on file. Not on File Prior to Admission medications   Not on File     Positive ROS: Otherwise negative.  No vertigo.  All other systems have been reviewed and were otherwise negative with the exception of those mentioned in the HPI and as above.  Physical Exam: Constitutional: Alert, well-appearing, no acute distress.  No ear pain today. Ears: External ears without lesions or tenderness. Ear canals have minimal wax buildup that was cleaned with suction.  Both TMs were clear with good mobility on pneumatic otoscopy.  There were no drainage or evidence of fungal disease in either ear canal.  No significant swelling.  On hearing screening with the 512 and 1024 tuning forks he heard well in both ears with AC > BC bilaterally. Nasal: External nose without lesions. Septum minimal deformity.. Clear nasal passages.  No signs of infection.  Mild rhinitis Oral: Lips and gums without lesions. Tongue and palate mucosa without lesions. Posterior oropharynx clear.  Neck: No palpable adenopathy or masses Respiratory: Breathing comfortably  Skin: No facial/neck lesions or rash noted.  Cerumen impaction removal  Date/Time: 11/04/2019 5:04 PM Performed by: Rozetta Nunnery, MD Authorized by: Rozetta Nunnery, MD   Consent:    Consent obtained:  Verbal   Consent given by:  Patient   Risks discussed:  Pain and bleeding Procedure details:    Location:  L ear and R ear   Procedure type: curette and suction   Post-procedure details:    Inspection:  TM intact and canal normal   Hearing quality:  Improved   Patient tolerance of procedure:  Tolerated well, no immediate complications Comments:     He  had minimal wax in the ear canals that was cleaned with suction and curettes.  Presently no evidence of active infection.    Assessment: History of external otitis presently appears fairly clear. Perhaps some eustachian tube dysfunction causing the congestion in the ear but no middle ear effusion noted.  Plan: Agree with the use of Flonase 2 sprays each nostril at night over the next 4 weeks. If he continues to feel congested in the left ear recommend follow-up for audiologic testing in 1 month. Also discussed the possible use of alcohol and vinegar ear rinses or drops if he has any itching or fluid buildup in the ear canal by the ear canals are clear on exam today.   Radene Journey, MD   CC:

## 2019-11-17 DIAGNOSIS — E538 Deficiency of other specified B group vitamins: Secondary | ICD-10-CM | POA: Diagnosis not present

## 2019-11-17 DIAGNOSIS — Z125 Encounter for screening for malignant neoplasm of prostate: Secondary | ICD-10-CM | POA: Diagnosis not present

## 2019-11-17 DIAGNOSIS — Z794 Long term (current) use of insulin: Secondary | ICD-10-CM | POA: Diagnosis not present

## 2019-11-17 DIAGNOSIS — Z0000 Encounter for general adult medical examination without abnormal findings: Secondary | ICD-10-CM | POA: Diagnosis not present

## 2019-11-17 DIAGNOSIS — E7800 Pure hypercholesterolemia, unspecified: Secondary | ICD-10-CM | POA: Diagnosis not present

## 2019-11-17 DIAGNOSIS — N1831 Chronic kidney disease, stage 3a: Secondary | ICD-10-CM | POA: Diagnosis not present

## 2019-11-17 DIAGNOSIS — I129 Hypertensive chronic kidney disease with stage 1 through stage 4 chronic kidney disease, or unspecified chronic kidney disease: Secondary | ICD-10-CM | POA: Diagnosis not present

## 2019-11-17 DIAGNOSIS — E1121 Type 2 diabetes mellitus with diabetic nephropathy: Secondary | ICD-10-CM | POA: Diagnosis not present

## 2019-11-18 DIAGNOSIS — E538 Deficiency of other specified B group vitamins: Secondary | ICD-10-CM | POA: Diagnosis not present

## 2019-11-18 DIAGNOSIS — E1122 Type 2 diabetes mellitus with diabetic chronic kidney disease: Secondary | ICD-10-CM | POA: Diagnosis not present

## 2019-11-18 DIAGNOSIS — E1121 Type 2 diabetes mellitus with diabetic nephropathy: Secondary | ICD-10-CM | POA: Diagnosis not present

## 2019-11-18 DIAGNOSIS — N1831 Chronic kidney disease, stage 3a: Secondary | ICD-10-CM | POA: Diagnosis not present

## 2019-11-18 DIAGNOSIS — Z125 Encounter for screening for malignant neoplasm of prostate: Secondary | ICD-10-CM | POA: Diagnosis not present

## 2019-11-18 DIAGNOSIS — I129 Hypertensive chronic kidney disease with stage 1 through stage 4 chronic kidney disease, or unspecified chronic kidney disease: Secondary | ICD-10-CM | POA: Diagnosis not present

## 2019-11-18 DIAGNOSIS — E7800 Pure hypercholesterolemia, unspecified: Secondary | ICD-10-CM | POA: Diagnosis not present

## 2019-11-18 DIAGNOSIS — Z794 Long term (current) use of insulin: Secondary | ICD-10-CM | POA: Diagnosis not present

## 2019-12-03 DIAGNOSIS — G4733 Obstructive sleep apnea (adult) (pediatric): Secondary | ICD-10-CM | POA: Diagnosis not present

## 2019-12-09 DIAGNOSIS — E1121 Type 2 diabetes mellitus with diabetic nephropathy: Secondary | ICD-10-CM | POA: Diagnosis not present

## 2019-12-09 DIAGNOSIS — E782 Mixed hyperlipidemia: Secondary | ICD-10-CM | POA: Diagnosis not present

## 2019-12-09 DIAGNOSIS — Z794 Long term (current) use of insulin: Secondary | ICD-10-CM | POA: Diagnosis not present

## 2019-12-09 DIAGNOSIS — R002 Palpitations: Secondary | ICD-10-CM | POA: Diagnosis not present

## 2019-12-09 DIAGNOSIS — R0600 Dyspnea, unspecified: Secondary | ICD-10-CM | POA: Diagnosis not present

## 2019-12-09 DIAGNOSIS — G4733 Obstructive sleep apnea (adult) (pediatric): Secondary | ICD-10-CM | POA: Diagnosis not present

## 2019-12-09 DIAGNOSIS — I129 Hypertensive chronic kidney disease with stage 1 through stage 4 chronic kidney disease, or unspecified chronic kidney disease: Secondary | ICD-10-CM | POA: Diagnosis not present

## 2019-12-09 DIAGNOSIS — N1831 Chronic kidney disease, stage 3a: Secondary | ICD-10-CM | POA: Diagnosis not present

## 2019-12-18 DIAGNOSIS — G4733 Obstructive sleep apnea (adult) (pediatric): Secondary | ICD-10-CM | POA: Diagnosis not present

## 2019-12-31 DIAGNOSIS — G4733 Obstructive sleep apnea (adult) (pediatric): Secondary | ICD-10-CM | POA: Diagnosis not present

## 2020-01-27 DIAGNOSIS — I499 Cardiac arrhythmia, unspecified: Secondary | ICD-10-CM | POA: Diagnosis not present

## 2020-01-27 DIAGNOSIS — I10 Essential (primary) hypertension: Secondary | ICD-10-CM | POA: Diagnosis not present

## 2020-01-27 DIAGNOSIS — E1165 Type 2 diabetes mellitus with hyperglycemia: Secondary | ICD-10-CM | POA: Diagnosis not present

## 2020-01-27 DIAGNOSIS — N183 Chronic kidney disease, stage 3 unspecified: Secondary | ICD-10-CM | POA: Diagnosis not present

## 2020-01-31 DIAGNOSIS — G4733 Obstructive sleep apnea (adult) (pediatric): Secondary | ICD-10-CM | POA: Diagnosis not present

## 2020-03-01 DIAGNOSIS — G4733 Obstructive sleep apnea (adult) (pediatric): Secondary | ICD-10-CM | POA: Diagnosis not present

## 2020-03-18 DIAGNOSIS — G4733 Obstructive sleep apnea (adult) (pediatric): Secondary | ICD-10-CM | POA: Diagnosis not present

## 2020-04-01 DIAGNOSIS — G4733 Obstructive sleep apnea (adult) (pediatric): Secondary | ICD-10-CM | POA: Diagnosis not present

## 2020-04-28 DIAGNOSIS — N183 Chronic kidney disease, stage 3 unspecified: Secondary | ICD-10-CM | POA: Diagnosis not present

## 2020-04-28 DIAGNOSIS — E1165 Type 2 diabetes mellitus with hyperglycemia: Secondary | ICD-10-CM | POA: Diagnosis not present

## 2020-04-28 DIAGNOSIS — I10 Essential (primary) hypertension: Secondary | ICD-10-CM | POA: Diagnosis not present

## 2020-04-28 DIAGNOSIS — I499 Cardiac arrhythmia, unspecified: Secondary | ICD-10-CM | POA: Diagnosis not present

## 2020-05-01 DIAGNOSIS — G4733 Obstructive sleep apnea (adult) (pediatric): Secondary | ICD-10-CM | POA: Diagnosis not present

## 2020-05-17 DIAGNOSIS — E7800 Pure hypercholesterolemia, unspecified: Secondary | ICD-10-CM | POA: Diagnosis not present

## 2020-05-17 DIAGNOSIS — E538 Deficiency of other specified B group vitamins: Secondary | ICD-10-CM | POA: Diagnosis not present

## 2020-05-17 DIAGNOSIS — Z9989 Dependence on other enabling machines and devices: Secondary | ICD-10-CM | POA: Diagnosis not present

## 2020-05-17 DIAGNOSIS — Z79899 Other long term (current) drug therapy: Secondary | ICD-10-CM | POA: Diagnosis not present

## 2020-05-17 DIAGNOSIS — Z7982 Long term (current) use of aspirin: Secondary | ICD-10-CM | POA: Diagnosis not present

## 2020-05-17 DIAGNOSIS — Z794 Long term (current) use of insulin: Secondary | ICD-10-CM | POA: Diagnosis not present

## 2020-05-17 DIAGNOSIS — G4733 Obstructive sleep apnea (adult) (pediatric): Secondary | ICD-10-CM | POA: Diagnosis not present

## 2020-05-17 DIAGNOSIS — I129 Hypertensive chronic kidney disease with stage 1 through stage 4 chronic kidney disease, or unspecified chronic kidney disease: Secondary | ICD-10-CM | POA: Diagnosis not present

## 2020-05-17 DIAGNOSIS — Z7951 Long term (current) use of inhaled steroids: Secondary | ICD-10-CM | POA: Diagnosis not present

## 2020-05-17 DIAGNOSIS — I10 Essential (primary) hypertension: Secondary | ICD-10-CM | POA: Diagnosis not present

## 2020-05-17 DIAGNOSIS — E1122 Type 2 diabetes mellitus with diabetic chronic kidney disease: Secondary | ICD-10-CM | POA: Diagnosis not present

## 2020-05-17 DIAGNOSIS — N1831 Chronic kidney disease, stage 3a: Secondary | ICD-10-CM | POA: Diagnosis not present

## 2020-05-26 DIAGNOSIS — Z1211 Encounter for screening for malignant neoplasm of colon: Secondary | ICD-10-CM | POA: Diagnosis not present

## 2020-05-26 DIAGNOSIS — Z1212 Encounter for screening for malignant neoplasm of rectum: Secondary | ICD-10-CM | POA: Diagnosis not present

## 2020-06-01 DIAGNOSIS — G4733 Obstructive sleep apnea (adult) (pediatric): Secondary | ICD-10-CM | POA: Diagnosis not present

## 2020-06-11 DIAGNOSIS — G4733 Obstructive sleep apnea (adult) (pediatric): Secondary | ICD-10-CM | POA: Diagnosis not present

## 2020-06-25 DIAGNOSIS — H524 Presbyopia: Secondary | ICD-10-CM | POA: Diagnosis not present

## 2020-06-25 DIAGNOSIS — E119 Type 2 diabetes mellitus without complications: Secondary | ICD-10-CM | POA: Diagnosis not present

## 2020-06-25 DIAGNOSIS — H35033 Hypertensive retinopathy, bilateral: Secondary | ICD-10-CM | POA: Diagnosis not present

## 2020-06-25 DIAGNOSIS — H2513 Age-related nuclear cataract, bilateral: Secondary | ICD-10-CM | POA: Diagnosis not present

## 2020-06-29 DIAGNOSIS — G4733 Obstructive sleep apnea (adult) (pediatric): Secondary | ICD-10-CM | POA: Diagnosis not present

## 2020-07-19 DIAGNOSIS — Z23 Encounter for immunization: Secondary | ICD-10-CM | POA: Diagnosis not present

## 2020-08-06 DIAGNOSIS — E1122 Type 2 diabetes mellitus with diabetic chronic kidney disease: Secondary | ICD-10-CM | POA: Diagnosis not present

## 2020-08-06 DIAGNOSIS — E7800 Pure hypercholesterolemia, unspecified: Secondary | ICD-10-CM | POA: Diagnosis not present

## 2020-08-06 DIAGNOSIS — N183 Chronic kidney disease, stage 3 unspecified: Secondary | ICD-10-CM | POA: Diagnosis not present

## 2020-08-06 DIAGNOSIS — Z794 Long term (current) use of insulin: Secondary | ICD-10-CM | POA: Diagnosis not present

## 2020-08-06 DIAGNOSIS — E538 Deficiency of other specified B group vitamins: Secondary | ICD-10-CM | POA: Diagnosis not present

## 2020-08-17 DIAGNOSIS — I129 Hypertensive chronic kidney disease with stage 1 through stage 4 chronic kidney disease, or unspecified chronic kidney disease: Secondary | ICD-10-CM | POA: Diagnosis not present

## 2020-08-17 DIAGNOSIS — Z794 Long term (current) use of insulin: Secondary | ICD-10-CM | POA: Diagnosis not present

## 2020-08-17 DIAGNOSIS — E1122 Type 2 diabetes mellitus with diabetic chronic kidney disease: Secondary | ICD-10-CM | POA: Diagnosis not present

## 2020-08-17 DIAGNOSIS — E7800 Pure hypercholesterolemia, unspecified: Secondary | ICD-10-CM | POA: Diagnosis not present

## 2020-08-17 DIAGNOSIS — N1831 Chronic kidney disease, stage 3a: Secondary | ICD-10-CM | POA: Diagnosis not present

## 2020-09-08 DIAGNOSIS — Z7982 Long term (current) use of aspirin: Secondary | ICD-10-CM | POA: Diagnosis not present

## 2020-09-08 DIAGNOSIS — K921 Melena: Secondary | ICD-10-CM | POA: Diagnosis not present

## 2020-09-08 DIAGNOSIS — Z794 Long term (current) use of insulin: Secondary | ICD-10-CM | POA: Diagnosis not present

## 2020-09-08 DIAGNOSIS — E1122 Type 2 diabetes mellitus with diabetic chronic kidney disease: Secondary | ICD-10-CM | POA: Diagnosis not present

## 2020-09-08 DIAGNOSIS — K625 Hemorrhage of anus and rectum: Secondary | ICD-10-CM | POA: Diagnosis not present

## 2020-09-08 DIAGNOSIS — R197 Diarrhea, unspecified: Secondary | ICD-10-CM | POA: Diagnosis not present

## 2020-09-08 DIAGNOSIS — Z86010 Personal history of colonic polyps: Secondary | ICD-10-CM | POA: Diagnosis not present

## 2020-09-08 DIAGNOSIS — K648 Other hemorrhoids: Secondary | ICD-10-CM | POA: Diagnosis not present

## 2020-09-08 DIAGNOSIS — N183 Chronic kidney disease, stage 3 unspecified: Secondary | ICD-10-CM | POA: Diagnosis not present

## 2020-09-08 DIAGNOSIS — K449 Diaphragmatic hernia without obstruction or gangrene: Secondary | ICD-10-CM | POA: Diagnosis not present

## 2020-09-08 DIAGNOSIS — K5791 Diverticulosis of intestine, part unspecified, without perforation or abscess with bleeding: Secondary | ICD-10-CM | POA: Diagnosis not present

## 2020-09-08 DIAGNOSIS — Z7984 Long term (current) use of oral hypoglycemic drugs: Secondary | ICD-10-CM | POA: Diagnosis not present

## 2020-09-08 DIAGNOSIS — D62 Acute posthemorrhagic anemia: Secondary | ICD-10-CM | POA: Diagnosis not present

## 2020-09-08 DIAGNOSIS — Z87891 Personal history of nicotine dependence: Secondary | ICD-10-CM | POA: Diagnosis not present

## 2020-09-08 DIAGNOSIS — K5730 Diverticulosis of large intestine without perforation or abscess without bleeding: Secondary | ICD-10-CM | POA: Diagnosis not present

## 2020-09-08 DIAGNOSIS — K429 Umbilical hernia without obstruction or gangrene: Secondary | ICD-10-CM | POA: Diagnosis not present

## 2020-09-08 DIAGNOSIS — D649 Anemia, unspecified: Secondary | ICD-10-CM | POA: Diagnosis not present

## 2020-09-08 DIAGNOSIS — N400 Enlarged prostate without lower urinary tract symptoms: Secondary | ICD-10-CM | POA: Diagnosis not present

## 2020-09-08 DIAGNOSIS — G4730 Sleep apnea, unspecified: Secondary | ICD-10-CM | POA: Diagnosis not present

## 2020-09-08 DIAGNOSIS — I129 Hypertensive chronic kidney disease with stage 1 through stage 4 chronic kidney disease, or unspecified chronic kidney disease: Secondary | ICD-10-CM | POA: Diagnosis not present

## 2020-09-09 DIAGNOSIS — D649 Anemia, unspecified: Secondary | ICD-10-CM | POA: Insufficient documentation

## 2020-09-13 DIAGNOSIS — N183 Chronic kidney disease, stage 3 unspecified: Secondary | ICD-10-CM | POA: Diagnosis not present

## 2020-09-13 DIAGNOSIS — E1122 Type 2 diabetes mellitus with diabetic chronic kidney disease: Secondary | ICD-10-CM | POA: Diagnosis not present

## 2020-09-13 DIAGNOSIS — K921 Melena: Secondary | ICD-10-CM | POA: Diagnosis not present

## 2020-09-13 DIAGNOSIS — Z794 Long term (current) use of insulin: Secondary | ICD-10-CM | POA: Diagnosis not present

## 2020-09-17 DIAGNOSIS — I129 Hypertensive chronic kidney disease with stage 1 through stage 4 chronic kidney disease, or unspecified chronic kidney disease: Secondary | ICD-10-CM | POA: Diagnosis not present

## 2020-09-17 DIAGNOSIS — E1122 Type 2 diabetes mellitus with diabetic chronic kidney disease: Secondary | ICD-10-CM | POA: Diagnosis not present

## 2020-09-17 DIAGNOSIS — D500 Iron deficiency anemia secondary to blood loss (chronic): Secondary | ICD-10-CM | POA: Diagnosis not present

## 2020-09-17 DIAGNOSIS — Z09 Encounter for follow-up examination after completed treatment for conditions other than malignant neoplasm: Secondary | ICD-10-CM | POA: Diagnosis not present

## 2020-09-17 DIAGNOSIS — G4733 Obstructive sleep apnea (adult) (pediatric): Secondary | ICD-10-CM | POA: Diagnosis not present

## 2020-09-17 DIAGNOSIS — N183 Chronic kidney disease, stage 3 unspecified: Secondary | ICD-10-CM | POA: Diagnosis not present

## 2020-09-17 DIAGNOSIS — N1831 Chronic kidney disease, stage 3a: Secondary | ICD-10-CM | POA: Diagnosis not present

## 2020-09-17 DIAGNOSIS — K5730 Diverticulosis of large intestine without perforation or abscess without bleeding: Secondary | ICD-10-CM | POA: Diagnosis not present

## 2020-09-17 DIAGNOSIS — Z794 Long term (current) use of insulin: Secondary | ICD-10-CM | POA: Diagnosis not present

## 2020-10-29 DIAGNOSIS — G4733 Obstructive sleep apnea (adult) (pediatric): Secondary | ICD-10-CM | POA: Diagnosis not present

## 2020-11-10 DIAGNOSIS — D500 Iron deficiency anemia secondary to blood loss (chronic): Secondary | ICD-10-CM | POA: Diagnosis not present

## 2020-11-17 DIAGNOSIS — E538 Deficiency of other specified B group vitamins: Secondary | ICD-10-CM | POA: Diagnosis not present

## 2020-11-17 DIAGNOSIS — I129 Hypertensive chronic kidney disease with stage 1 through stage 4 chronic kidney disease, or unspecified chronic kidney disease: Secondary | ICD-10-CM | POA: Diagnosis not present

## 2020-11-17 DIAGNOSIS — Z7984 Long term (current) use of oral hypoglycemic drugs: Secondary | ICD-10-CM | POA: Diagnosis not present

## 2020-11-17 DIAGNOSIS — M79672 Pain in left foot: Secondary | ICD-10-CM | POA: Diagnosis not present

## 2020-11-17 DIAGNOSIS — E7800 Pure hypercholesterolemia, unspecified: Secondary | ICD-10-CM | POA: Diagnosis not present

## 2020-11-17 DIAGNOSIS — E1122 Type 2 diabetes mellitus with diabetic chronic kidney disease: Secondary | ICD-10-CM | POA: Diagnosis not present

## 2020-11-17 DIAGNOSIS — Z794 Long term (current) use of insulin: Secondary | ICD-10-CM | POA: Diagnosis not present

## 2020-11-17 DIAGNOSIS — D500 Iron deficiency anemia secondary to blood loss (chronic): Secondary | ICD-10-CM | POA: Diagnosis not present

## 2020-11-17 DIAGNOSIS — N1831 Chronic kidney disease, stage 3a: Secondary | ICD-10-CM | POA: Diagnosis not present

## 2020-12-08 DIAGNOSIS — Z794 Long term (current) use of insulin: Secondary | ICD-10-CM | POA: Diagnosis not present

## 2020-12-08 DIAGNOSIS — E7800 Pure hypercholesterolemia, unspecified: Secondary | ICD-10-CM | POA: Diagnosis not present

## 2020-12-08 DIAGNOSIS — N183 Chronic kidney disease, stage 3 unspecified: Secondary | ICD-10-CM | POA: Diagnosis not present

## 2020-12-08 DIAGNOSIS — E1122 Type 2 diabetes mellitus with diabetic chronic kidney disease: Secondary | ICD-10-CM | POA: Diagnosis not present

## 2020-12-08 DIAGNOSIS — M79672 Pain in left foot: Secondary | ICD-10-CM | POA: Diagnosis not present

## 2020-12-08 DIAGNOSIS — E538 Deficiency of other specified B group vitamins: Secondary | ICD-10-CM | POA: Diagnosis not present

## 2020-12-08 DIAGNOSIS — D500 Iron deficiency anemia secondary to blood loss (chronic): Secondary | ICD-10-CM | POA: Diagnosis not present

## 2020-12-14 DIAGNOSIS — N1831 Chronic kidney disease, stage 3a: Secondary | ICD-10-CM | POA: Diagnosis not present

## 2020-12-14 DIAGNOSIS — E782 Mixed hyperlipidemia: Secondary | ICD-10-CM | POA: Diagnosis not present

## 2020-12-14 DIAGNOSIS — Z794 Long term (current) use of insulin: Secondary | ICD-10-CM | POA: Diagnosis not present

## 2020-12-14 DIAGNOSIS — I129 Hypertensive chronic kidney disease with stage 1 through stage 4 chronic kidney disease, or unspecified chronic kidney disease: Secondary | ICD-10-CM | POA: Diagnosis not present

## 2020-12-14 DIAGNOSIS — E1122 Type 2 diabetes mellitus with diabetic chronic kidney disease: Secondary | ICD-10-CM | POA: Diagnosis not present

## 2020-12-14 DIAGNOSIS — R002 Palpitations: Secondary | ICD-10-CM | POA: Diagnosis not present

## 2020-12-14 DIAGNOSIS — R0600 Dyspnea, unspecified: Secondary | ICD-10-CM | POA: Diagnosis not present

## 2020-12-20 DIAGNOSIS — N1831 Chronic kidney disease, stage 3a: Secondary | ICD-10-CM | POA: Diagnosis not present

## 2020-12-20 DIAGNOSIS — E7800 Pure hypercholesterolemia, unspecified: Secondary | ICD-10-CM | POA: Diagnosis not present

## 2020-12-20 DIAGNOSIS — Z7984 Long term (current) use of oral hypoglycemic drugs: Secondary | ICD-10-CM | POA: Diagnosis not present

## 2020-12-20 DIAGNOSIS — Z87891 Personal history of nicotine dependence: Secondary | ICD-10-CM | POA: Diagnosis not present

## 2020-12-20 DIAGNOSIS — I129 Hypertensive chronic kidney disease with stage 1 through stage 4 chronic kidney disease, or unspecified chronic kidney disease: Secondary | ICD-10-CM | POA: Diagnosis not present

## 2020-12-20 DIAGNOSIS — D500 Iron deficiency anemia secondary to blood loss (chronic): Secondary | ICD-10-CM | POA: Diagnosis not present

## 2020-12-20 DIAGNOSIS — M1A0790 Idiopathic chronic gout, unspecified ankle and foot, without tophus (tophi): Secondary | ICD-10-CM | POA: Diagnosis not present

## 2020-12-20 DIAGNOSIS — E538 Deficiency of other specified B group vitamins: Secondary | ICD-10-CM | POA: Diagnosis not present

## 2020-12-20 DIAGNOSIS — E1122 Type 2 diabetes mellitus with diabetic chronic kidney disease: Secondary | ICD-10-CM | POA: Diagnosis not present

## 2020-12-20 DIAGNOSIS — Z794 Long term (current) use of insulin: Secondary | ICD-10-CM | POA: Diagnosis not present

## 2021-01-11 DIAGNOSIS — Z111 Encounter for screening for respiratory tuberculosis: Secondary | ICD-10-CM | POA: Diagnosis not present

## 2021-01-14 DIAGNOSIS — G4733 Obstructive sleep apnea (adult) (pediatric): Secondary | ICD-10-CM | POA: Diagnosis not present

## 2021-02-03 DIAGNOSIS — D485 Neoplasm of uncertain behavior of skin: Secondary | ICD-10-CM | POA: Diagnosis not present

## 2021-02-03 DIAGNOSIS — L821 Other seborrheic keratosis: Secondary | ICD-10-CM | POA: Diagnosis not present

## 2021-02-03 DIAGNOSIS — L720 Epidermal cyst: Secondary | ICD-10-CM | POA: Diagnosis not present

## 2021-02-03 DIAGNOSIS — C44722 Squamous cell carcinoma of skin of right lower limb, including hip: Secondary | ICD-10-CM | POA: Diagnosis not present

## 2021-02-24 DIAGNOSIS — C44722 Squamous cell carcinoma of skin of right lower limb, including hip: Secondary | ICD-10-CM | POA: Diagnosis not present

## 2021-03-09 DIAGNOSIS — E538 Deficiency of other specified B group vitamins: Secondary | ICD-10-CM | POA: Diagnosis not present

## 2021-03-09 DIAGNOSIS — E1122 Type 2 diabetes mellitus with diabetic chronic kidney disease: Secondary | ICD-10-CM | POA: Diagnosis not present

## 2021-03-09 DIAGNOSIS — Z794 Long term (current) use of insulin: Secondary | ICD-10-CM | POA: Diagnosis not present

## 2021-03-09 DIAGNOSIS — N183 Chronic kidney disease, stage 3 unspecified: Secondary | ICD-10-CM | POA: Diagnosis not present

## 2021-03-09 DIAGNOSIS — D500 Iron deficiency anemia secondary to blood loss (chronic): Secondary | ICD-10-CM | POA: Diagnosis not present

## 2021-03-09 DIAGNOSIS — M1A0790 Idiopathic chronic gout, unspecified ankle and foot, without tophus (tophi): Secondary | ICD-10-CM | POA: Diagnosis not present

## 2021-03-23 DIAGNOSIS — Z7984 Long term (current) use of oral hypoglycemic drugs: Secondary | ICD-10-CM | POA: Diagnosis not present

## 2021-03-23 DIAGNOSIS — E1122 Type 2 diabetes mellitus with diabetic chronic kidney disease: Secondary | ICD-10-CM | POA: Diagnosis not present

## 2021-03-23 DIAGNOSIS — Z87891 Personal history of nicotine dependence: Secondary | ICD-10-CM | POA: Diagnosis not present

## 2021-03-23 DIAGNOSIS — Z794 Long term (current) use of insulin: Secondary | ICD-10-CM | POA: Diagnosis not present

## 2021-03-23 DIAGNOSIS — I129 Hypertensive chronic kidney disease with stage 1 through stage 4 chronic kidney disease, or unspecified chronic kidney disease: Secondary | ICD-10-CM | POA: Diagnosis not present

## 2021-03-23 DIAGNOSIS — M1A9XX0 Chronic gout, unspecified, without tophus (tophi): Secondary | ICD-10-CM | POA: Diagnosis not present

## 2021-03-23 DIAGNOSIS — N1831 Chronic kidney disease, stage 3a: Secondary | ICD-10-CM | POA: Diagnosis not present

## 2021-03-23 DIAGNOSIS — E7800 Pure hypercholesterolemia, unspecified: Secondary | ICD-10-CM | POA: Diagnosis not present

## 2021-03-23 DIAGNOSIS — C44722 Squamous cell carcinoma of skin of right lower limb, including hip: Secondary | ICD-10-CM | POA: Diagnosis not present

## 2021-05-27 DIAGNOSIS — G4733 Obstructive sleep apnea (adult) (pediatric): Secondary | ICD-10-CM | POA: Diagnosis not present

## 2021-05-29 NOTE — Progress Notes (Signed)
Referring-Lori Beane PA Reason for referral-hypertension  HPI: 70 year old male for evaluation of dyspnea and palpitations at request of Lanier Prude, [place].  Previously followed by Dr. Marijo File in [facility]. Stress echocardiogram October 2019 showed ejection fraction of 50% at rest that improved to 70% with exercise and no wall motion abnormalities noted.  Abdominal CT November 2021 showed no abdominal aortic aneurysm; there was note of an 8 mm nodule in the right lower lobe and follow-up recommended in 6 to 12 months.  Most recent lipids August 2022 showed total cholesterol 160 with LDL 92.  Sodium 141, potassium 4.4, BUN 12, creatinine 1.29 and liver functions normal.  Patient has dyspnea with more vigorous activities.  No orthopnea, PND, pedal edema, exertional chest pain or syncope.  Note he has not tolerated multiple antihypertensives including carvedilol, lisinopril, amlodipine, hydrochlorothiazide and olmesartan.  He has had difficulties with some of these medications causing lower extremity edema.  He denies claudication.  Cardiology now asked to evaluate.  Current Outpatient Medications  Medication Sig Dispense Refill   fluticasone (FLONASE) 50 MCG/ACT nasal spray Place 1 spray into the nose as directed.     insulin isophane & regular human KwikPen (NOVOLIN 70/30 KWIKPEN) (70-30) 100 UNIT/ML KwikPen 2 times per day 40 units and 50 units     Lancets (FREESTYLE) lancets 2 per day     loratadine (CLARITIN) 10 MG tablet Take 10 mg by mouth as directed.     meloxicam (MOBIC) 7.5 MG tablet Take 15 mg by mouth as directed.     metFORMIN (GLUCOPHAGE) 500 MG tablet Take 500 mg by mouth in the morning and at bedtime.     mometasone (ELOCON) 0.1 % cream APPLY TOPICALLY TO THE AFFECTED AREA TWICE A DAY FOR UP TO 5 DAYS AS NEEDED FOR ITCHING     ramipril (ALTACE) 10 MG capsule Take 10 mg by mouth daily.     rosuvastatin (CRESTOR) 10 MG tablet Take 10 mg by mouth once a week.     No current  facility-administered medications for this visit.    Allergies  Allergen Reactions   Insulin Detemir Other (See Comments)    Tingling of Mouth Tingling of Mouth      Past Medical History:  Diagnosis Date   Chronic kidney disease    Diabetes mellitus (HCC)    Diverticulosis    GERD (gastroesophageal reflux disease)    Hyperlipidemia    Hypertension    OSA (obstructive sleep apnea)    Palpitations    Squamous cell skin cancer, thigh     Past Surgical History:  Procedure Laterality Date   APPENDECTOMY     KNEE SURGERY     TONSILLECTOMY      Social History   Socioeconomic History   Marital status: Married    Spouse name: Not on file   Number of children: 1   Years of education: Not on file   Highest education level: Not on file  Occupational History   Not on file  Tobacco Use   Smoking status: Former    Types: Cigarettes   Smokeless tobacco: Not on file  Substance and Sexual Activity   Alcohol use: Yes    Comment: 2 beers per day   Drug use: Not on file   Sexual activity: Not on file  Other Topics Concern   Not on file  Social History Narrative   Not on file   Social Determinants of Health   Financial Resource Strain: Not on  file  Food Insecurity: Not on file  Transportation Needs: Not on file  Physical Activity: Not on file  Stress: Not on file  Social Connections: Not on file  Intimate Partner Violence: Not on file    Family History  Problem Relation Age of Onset   Heart attack Father     ROS: no fevers or chills, productive cough, hemoptysis, dysphasia, odynophagia, melena, hematochezia, dysuria, hematuria, rash, seizure activity, orthopnea, PND, pedal edema, claudication. Remaining systems are negative.  Physical Exam:   Blood pressure (!) 152/60, pulse 78, height '6\' 2"'$  (1.88 m), weight 237 lb (107.5 kg).  General:  Well developed/well nourished in NAD Skin warm/dry Patient not depressed No peripheral  clubbing Back-normal HEENT-normal/normal eyelids Neck supple/normal carotid upstroke bilaterally; no bruits; no JVD; no thyromegaly chest - CTA/ normal expansion CV - RRR/normal S1 and S2; no murmurs, rubs or gallops;  PMI nondisplaced Abdomen -NT/ND, no HSM, no mass, + bowel sounds, no bruit, umbilical hernia 2+ femoral pulses, no bruits Ext-no edema, chords, 2+ DP Neuro-grossly nonfocal  ECG -normal sinus rhythm, incomplete right bundle branch block.  Personally reviewed  A/P  1 dyspnea-patient has some dyspnea on exertion.  We will arrange a calcium score and if significantly elevated he may require a nuclear study.  2 hypertension-blood pressure is elevated.  We will continue ramipril.  He has not tolerated multiple medications previously.  We will try labetalol 100 mg twice daily.  If he does not tolerate we could try clonidine.  Follow blood pressure at home and adjust regimen as needed.  3 hyperlipidemia-continue low-dose Crestor.  He has had some intolerance to higher dose statins in the past due to myalgias.  If his calcium score significantly elevated I would like to advance his Crestor and if he does not tolerate add Zetia or Repatha.  4 lung nodule-follow-up primary care.  He needs a noncontrast chest CT in November.  Kirk Ruths, MD

## 2021-05-31 DIAGNOSIS — Z794 Long term (current) use of insulin: Secondary | ICD-10-CM | POA: Diagnosis not present

## 2021-05-31 DIAGNOSIS — E1122 Type 2 diabetes mellitus with diabetic chronic kidney disease: Secondary | ICD-10-CM | POA: Diagnosis not present

## 2021-05-31 DIAGNOSIS — N183 Chronic kidney disease, stage 3 unspecified: Secondary | ICD-10-CM | POA: Diagnosis not present

## 2021-05-31 DIAGNOSIS — M1A0790 Idiopathic chronic gout, unspecified ankle and foot, without tophus (tophi): Secondary | ICD-10-CM | POA: Diagnosis not present

## 2021-06-08 ENCOUNTER — Ambulatory Visit: Payer: PPO | Admitting: Cardiology

## 2021-06-08 ENCOUNTER — Encounter: Payer: Self-pay | Admitting: Cardiology

## 2021-06-08 ENCOUNTER — Other Ambulatory Visit: Payer: Self-pay

## 2021-06-08 VITALS — BP 152/60 | HR 78 | Ht 74.0 in | Wt 237.0 lb

## 2021-06-08 DIAGNOSIS — Z136 Encounter for screening for cardiovascular disorders: Secondary | ICD-10-CM | POA: Diagnosis not present

## 2021-06-08 DIAGNOSIS — I10 Essential (primary) hypertension: Secondary | ICD-10-CM | POA: Diagnosis not present

## 2021-06-08 DIAGNOSIS — E7801 Familial hypercholesterolemia: Secondary | ICD-10-CM | POA: Diagnosis not present

## 2021-06-08 DIAGNOSIS — E78019 Familial hypercholesterolemia, unspecified: Secondary | ICD-10-CM

## 2021-06-08 MED ORDER — LABETALOL HCL 100 MG PO TABS: 100.0000 mg | ORAL_TABLET | Freq: Two times a day (BID) | ORAL | 12 refills | Status: DC

## 2021-06-08 NOTE — Patient Instructions (Signed)
Medication Instructions:   START LABETALOL 100 MG TWICE DAILY  *If you need a refill on your cardiac medications before your next appointment, please call your pharmacy*   Testing/Procedures:  CORONARY CALCIUM SCORING CT AT THE [facility]-IMAGING DEPARTMENT    Follow-Up: At [facility], you and your health needs are our priority.  As part of our continuing mission to provide you with exceptional heart care, we have created designated Provider Care Teams.  These Care Teams include your primary Cardiologist (physician) and Advanced Practice Providers (APPs -  Physician Assistants and Nurse Practitioners) who all work together to provide you with the care you need, when you need it.  We recommend signing up for the patient portal called "MyChart".  Sign up information is provided on this After Visit Summary.  MyChart is used to connect with patients for Virtual Visits (Telemedicine).  Patients are able to view lab/test results, encounter notes, upcoming appointments, etc.  Non-urgent messages can be sent to your provider as well.   To learn more about what you can do with MyChart, go to [email or web address].    Your next appointment:   6 month(s)  The format for your next appointment:   In Person  Provider:   Kirk Ruths, MD

## 2021-06-15 ENCOUNTER — Other Ambulatory Visit: Payer: Self-pay

## 2021-06-15 ENCOUNTER — Telehealth: Payer: Self-pay | Admitting: Cardiology

## 2021-06-15 ENCOUNTER — Ambulatory Visit (HOSPITAL_BASED_OUTPATIENT_CLINIC_OR_DEPARTMENT_OTHER)
Admission: RE | Admit: 2021-06-15 | Discharge: 2021-06-15 | Disposition: A | Discharge status: Home / Self Care | Payer: PPO | Source: Ambulatory Visit | Attending: Cardiology | Admitting: Cardiology

## 2021-06-15 DIAGNOSIS — Z136 Encounter for screening for cardiovascular disorders: Secondary | ICD-10-CM

## 2021-06-15 DIAGNOSIS — R918 Other nonspecific abnormal finding of lung field: Secondary | ICD-10-CM

## 2021-06-15 IMAGING — CT CT CARDIAC CORONARY ARTERY CALCIUM SCORE
2 series · 14 of 20 positions shown, 16 images · non-contrast
Comparison: None.
COMPARISON: None.

Addendum:
EXAM:
OVER-READ INTERPRETATION  CT CHEST

The following report is an over-read performed by radiologist Dr.
CHE [REDACTED] on [DATE]. This
over-read does not include interpretation of cardiac or coronary
anatomy or pathology. The coronary calcium score interpretation by
the cardiologist is attached.
CLINICAL DATA: Cardiovascular Disease Risk stratification
Coronary Calcium Score
TECHNIQUE: A gated, non-contrast computed tomography scan of the heart was
performed using 3mm slice thickness. Axial images were analyzed on a
dedicated workstation. Calcium scoring of the coronary arteries was
performed using the Agatston method.

[Series 2: casc 3.0 i36f 2 (id) · axial · 0.39mm/px · z∈[-238,-145]mm · 8 of 41 slices shown, 10 images]
[im 5/41  vessel]
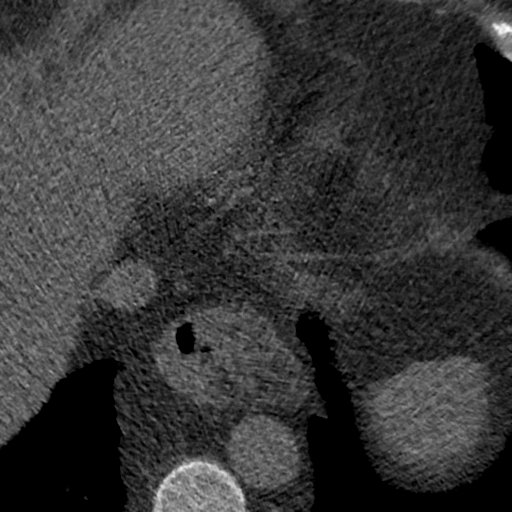
[im 5/41  lung]
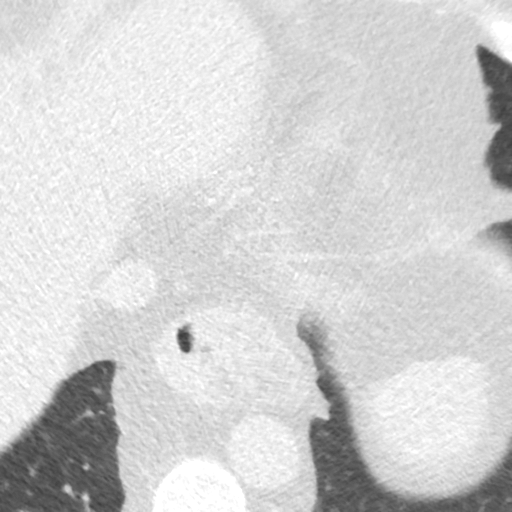
[im 9/41  vessel]
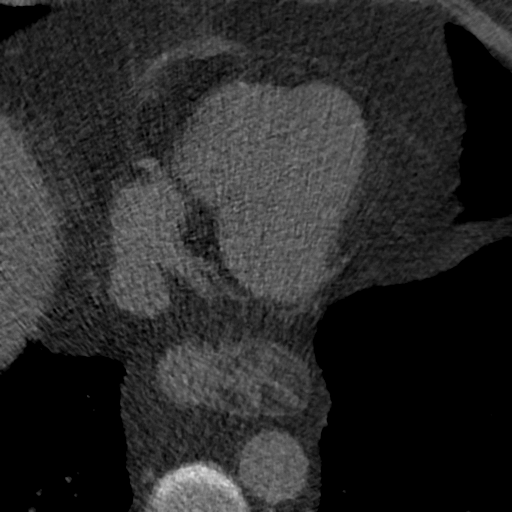
[im 14/41  vessel]
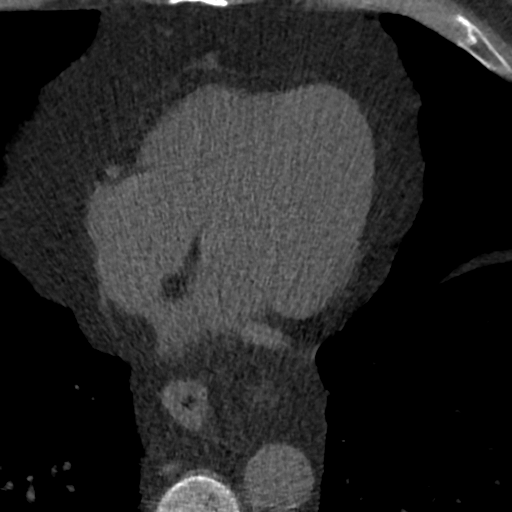
[im 18/41  vessel]
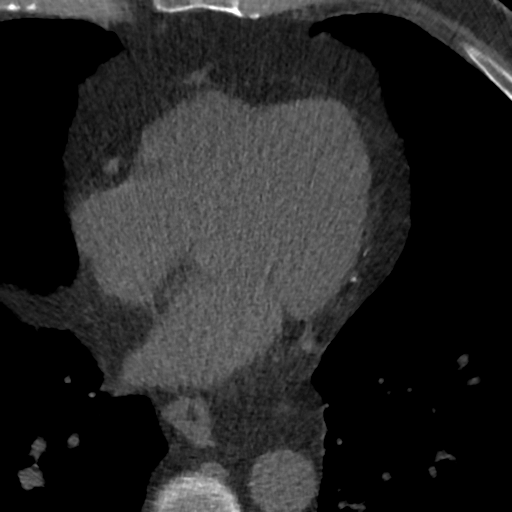
[im 23/41  vessel]
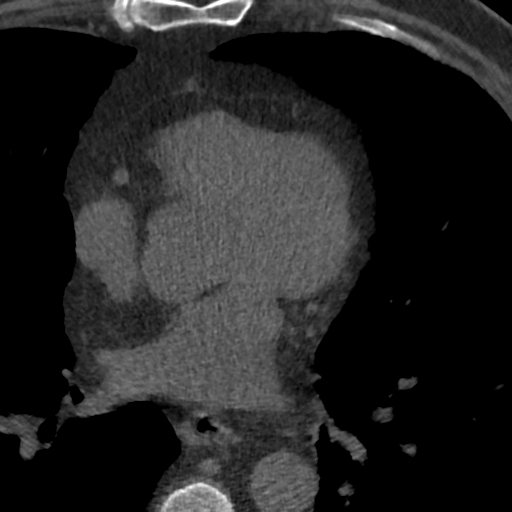
[im 23/41  lung]
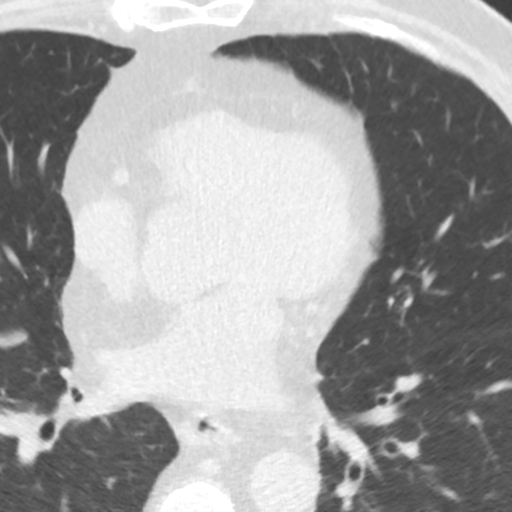
[im 27/41  vessel]
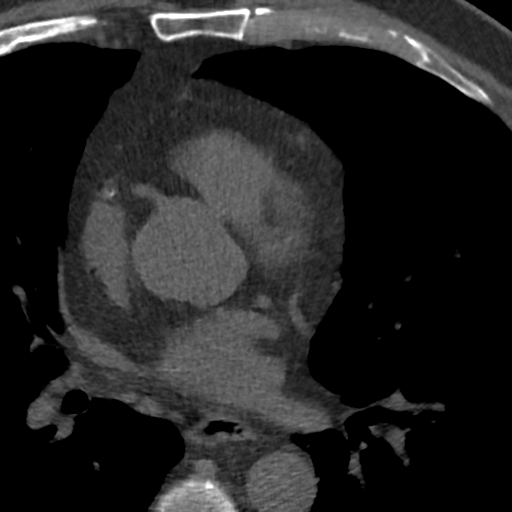
[im 32/41  vessel]
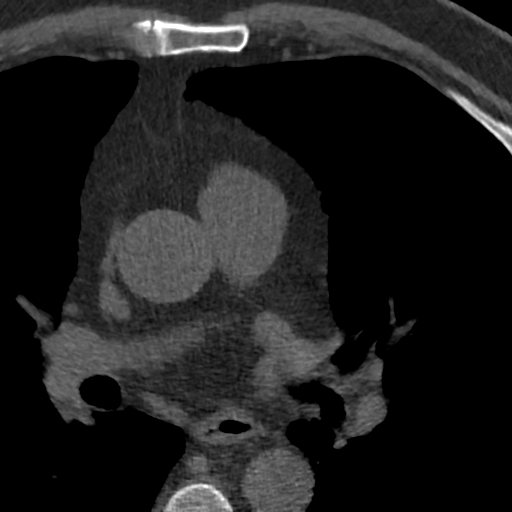
[im 36/41  vessel]
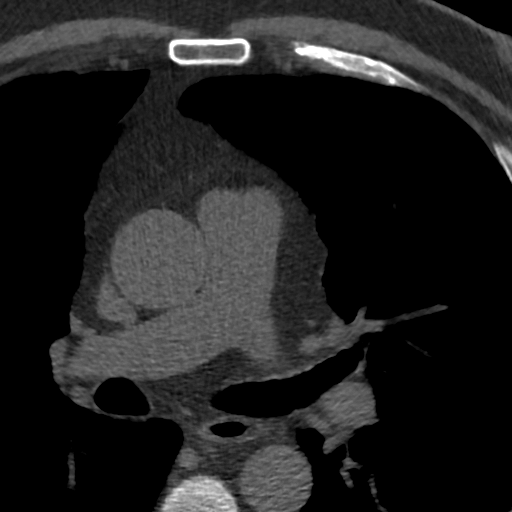

[Series 4: lung st · axial · 0.74mm/px · z∈[-238,-172]mm · 6 of 41 slices shown]
[im 5/41  lung]
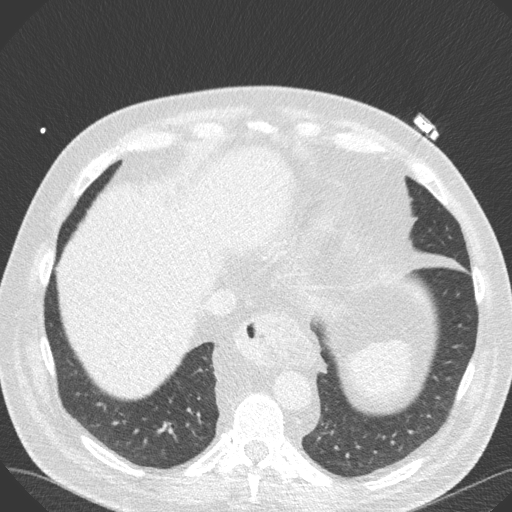
[im 9/41  lung]
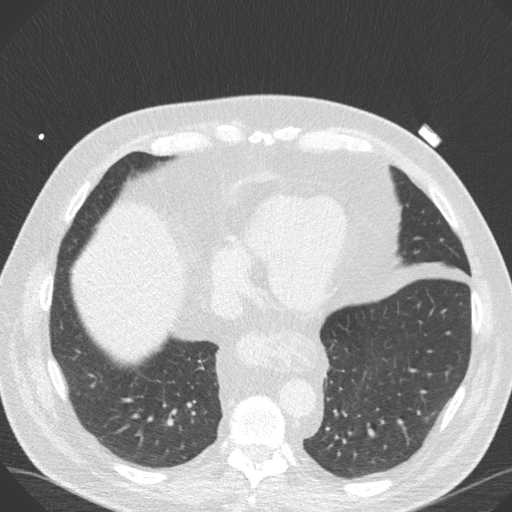
[im 14/41  lung]
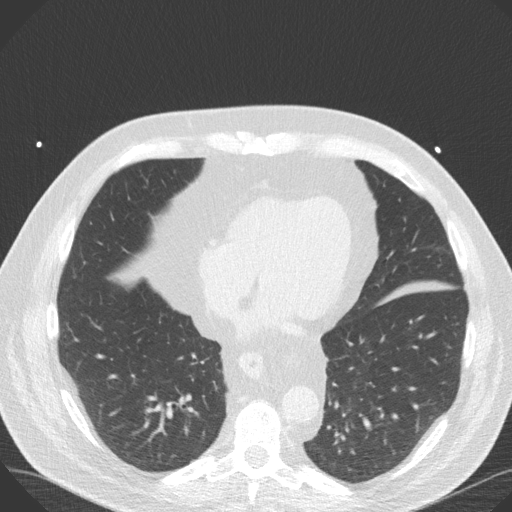
[im 18/41  lung]
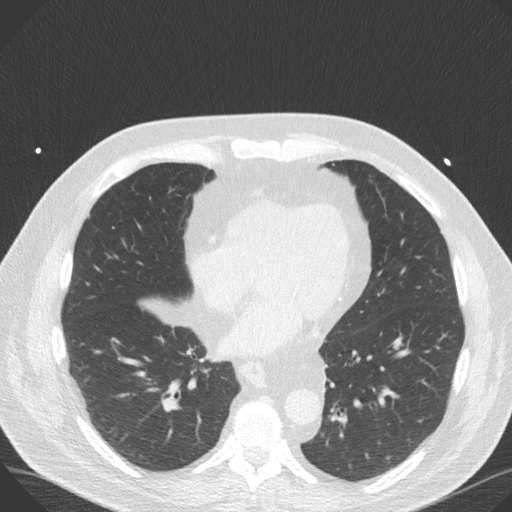
[im 23/41  lung]
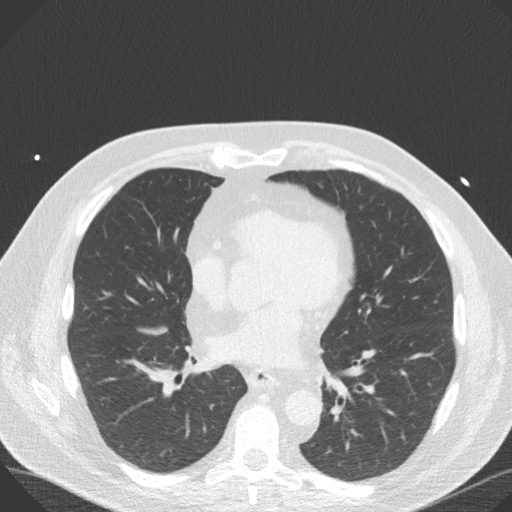
[im 27/41  lung]
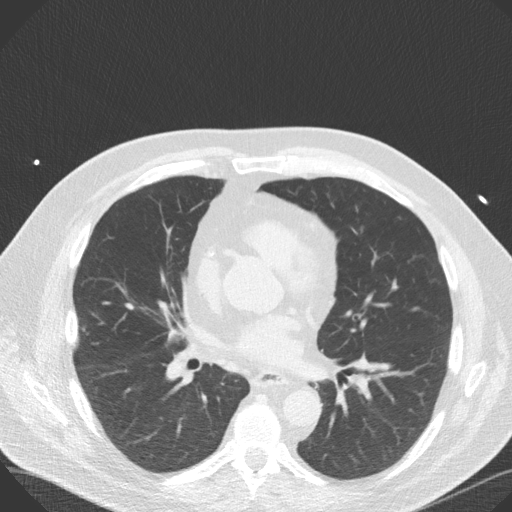

[14 of 20 positions shown; findings below may reference images not displayed]

FINDINGS: Moderate-sized hiatal hernia. Several tiny pulmonary nodules are
noted in the lung bases, similar to prior CT of the abdomen and
pelvis [DATE], favored to be benign, largest of which measures 7
mm in the right lower lobe (axial image 30 of series 3). Within the
visualized portions of the thorax there are no suspicious appearing
pulmonary nodules or masses, there is no acute consolidative
airspace disease, no pleural effusions, no pneumothorax and no
lymphadenopathy. Visualized portions of the upper abdomen
demonstrates diffuse low attenuation throughout the visualized
hepatic parenchyma, indicative of hepatic steatosis. There are no
aggressive appearing lytic or blastic lesions noted in the
visualized portions of the skeleton.
IMPRESSION: 1. Multiple small pulmonary nodules in the visualize lungs measuring
7 mm or less, stable compared to prior study from [DATE],
favored to be in benign. One additional noncontrast chest CT is
recommended in 12 months to ensure the stability of these nodules.
This recommendation follows the consensus statement: Guidelines for
Management of Incidental Pulmonary Nodules Detected on CT Images:
2. Moderate-sized hiatal hernia.
3. Hepatic steatosis.
FINDINGS: Coronary arteries: Normal origins.

Coronary Calcium Score:

Left main: 0

Left anterior descending artery: 28

Left circumflex artery: 0

Right coronary artery: 7

Total: 36

Percentile: 26

Pericardium: Normal.

Ascending Aorta: Normal caliber.

Non-cardiac: See separate report from [REDACTED].
IMPRESSION: Coronary calcium score of 36. This was 26 percentile for age-,
race-, and sex-matched controls.



If CAC=0, it is reasonable to withhold statin therapy and reassess
in 5 to 10 years, as long as higher risk conditions are absent
(diabetes mellitus, family history of premature CHD in first degree
relatives (males <55 years; females <65 years), cigarette smoking,
or LDL >=190 mg/dL).

If CAC is 1 to 99, it is reasonable to initiate statin therapy for
patients >=55 years of age.

If CAC is >=100 or >=75th percentile, it is reasonable to initiate
statin therapy at any age.

Cardiology referral should be considered for patients with CAC
scores >=400 or >=75th percentile.

*[TD] AHA/ACC/AACVPR/AAPA/ABC/CHE/CHE/CHE/CHE/CHE/CHE/CHE
Guideline on the Management of Blood Cholesterol: A Report of the
American College of Cardiology/American Heart Association Task Force
on Clinical Practice Guidelines. J Am Coll Cardiol.
[TD];73(24):[PHONE_NUMBER].

*** End of Addendum ***
EXAM:
OVER-READ INTERPRETATION  CT CHEST

The following report is an over-read performed by radiologist Dr.
CHE [REDACTED] on [DATE]. This
over-read does not include interpretation of cardiac or coronary
anatomy or pathology. The coronary calcium score interpretation by
the cardiologist is attached.
FINDINGS: Moderate-sized hiatal hernia. Several tiny pulmonary nodules are
noted in the lung bases, similar to prior CT of the abdomen and
pelvis [DATE], favored to be benign, largest of which measures 7
mm in the right lower lobe (axial image 30 of series 3). Within the
visualized portions of the thorax there are no suspicious appearing
pulmonary nodules or masses, there is no acute consolidative
airspace disease, no pleural effusions, no pneumothorax and no
lymphadenopathy. Visualized portions of the upper abdomen
demonstrates diffuse low attenuation throughout the visualized
hepatic parenchyma, indicative of hepatic steatosis. There are no
aggressive appearing lytic or blastic lesions noted in the
visualized portions of the skeleton.
IMPRESSION: 1. Multiple small pulmonary nodules in the visualize lungs measuring
7 mm or less, stable compared to prior study from [DATE],
favored to be in benign. One additional noncontrast chest CT is
recommended in 12 months to ensure the stability of these nodules.
This recommendation follows the consensus statement: Guidelines for
Management of Incidental Pulmonary Nodules Detected on CT Images:
2. Moderate-sized hiatal hernia.
3. Hepatic steatosis.

## 2021-06-15 NOTE — Telephone Encounter (Signed)
Spoke to patient coronary calcium score results given.Advised he will need to repeat a non contrast chest ct in 1 year.

## 2021-06-15 NOTE — Telephone Encounter (Signed)
Pt is returning call to Hilda Blades from earlier today. Please advise pt further [phone]

## 2021-06-16 DIAGNOSIS — R911 Solitary pulmonary nodule: Secondary | ICD-10-CM | POA: Diagnosis not present

## 2021-06-16 DIAGNOSIS — E7800 Pure hypercholesterolemia, unspecified: Secondary | ICD-10-CM | POA: Diagnosis not present

## 2021-06-16 DIAGNOSIS — I129 Hypertensive chronic kidney disease with stage 1 through stage 4 chronic kidney disease, or unspecified chronic kidney disease: Secondary | ICD-10-CM | POA: Diagnosis not present

## 2021-06-16 DIAGNOSIS — N1831 Chronic kidney disease, stage 3a: Secondary | ICD-10-CM | POA: Diagnosis not present

## 2021-06-16 DIAGNOSIS — Z87891 Personal history of nicotine dependence: Secondary | ICD-10-CM | POA: Diagnosis not present

## 2021-06-16 DIAGNOSIS — Z794 Long term (current) use of insulin: Secondary | ICD-10-CM | POA: Diagnosis not present

## 2021-06-16 DIAGNOSIS — E790 Hyperuricemia without signs of inflammatory arthritis and tophaceous disease: Secondary | ICD-10-CM | POA: Diagnosis not present

## 2021-06-16 DIAGNOSIS — E1122 Type 2 diabetes mellitus with diabetic chronic kidney disease: Secondary | ICD-10-CM | POA: Diagnosis not present

## 2021-06-16 DIAGNOSIS — Z7984 Long term (current) use of oral hypoglycemic drugs: Secondary | ICD-10-CM | POA: Diagnosis not present

## 2021-07-06 ENCOUNTER — Telehealth: Payer: Self-pay | Admitting: *Deleted

## 2021-07-06 NOTE — Telephone Encounter (Signed)
Pt is returning call.  

## 2021-07-06 NOTE — Telephone Encounter (Addendum)
-----   Message from Lelon Perla, MD sent at 06/15/2021  8:49 AM EDT ----- Follow-up noncontrast chest CT 1 year. [place], MD  06/16/2021 12:57 PM EDT     Increase crestor to 20 mg daily; lipids and liver 12 weeks, fu noncontrast chest CT one year Kirk Ruths   Left message for pt to call, he is aware of the CT results but not sure he got the med change.

## 2021-07-06 NOTE — Telephone Encounter (Signed)
Spoke to patient, discussed results and recommendations.   He states he is only taking rosuvastatin 10 mg once a week and has been doing this for over 2 years.  He states he took another type of cholesterol medication previously and was unable to walk.     Patient also frustrated as he is unsure why he was not given these results the 1st time he was called.  Explained that there are 2 parts to this test and usually once results prior to the other, apologized for the confusion.  Advised would clarify recommendations with MD and return call. Patient verbalized understanding.

## 2021-07-11 MED ORDER — ROSUVASTATIN CALCIUM 10 MG PO TABS: 10.0000 mg | ORAL_TABLET | ORAL | Status: DC

## 2021-07-11 NOTE — Telephone Encounter (Signed)
Spoke with pt, he is willing to try taking the 10 mg of crestor 3x weekly. He will let me know if he is able to tolerate and if so we will recheck lab work.

## 2021-07-13 DIAGNOSIS — G4733 Obstructive sleep apnea (adult) (pediatric): Secondary | ICD-10-CM | POA: Diagnosis not present

## 2021-07-21 DIAGNOSIS — Z23 Encounter for immunization: Secondary | ICD-10-CM | POA: Diagnosis not present

## 2021-08-15 ENCOUNTER — Telehealth: Payer: Self-pay | Admitting: Cardiology

## 2021-08-15 ENCOUNTER — Ambulatory Visit: Payer: PPO | Admitting: Cardiology

## 2021-08-15 ENCOUNTER — Other Ambulatory Visit: Payer: Self-pay

## 2021-08-15 ENCOUNTER — Encounter: Payer: Self-pay | Admitting: Cardiology

## 2021-08-15 VITALS — BP 138/92 | HR 85 | Ht 73.0 in | Wt 237.0 lb

## 2021-08-15 DIAGNOSIS — I10 Essential (primary) hypertension: Secondary | ICD-10-CM

## 2021-08-15 DIAGNOSIS — R918 Other nonspecific abnormal finding of lung field: Secondary | ICD-10-CM

## 2021-08-15 DIAGNOSIS — E7801 Familial hypercholesterolemia: Secondary | ICD-10-CM | POA: Diagnosis not present

## 2021-08-15 MED ORDER — CLONIDINE HCL 0.1 MG PO TABS: 0.1000 mg | ORAL_TABLET | Freq: Every day | ORAL | 3 refills | Status: DC

## 2021-08-15 MED ORDER — PRAVASTATIN SODIUM 40 MG PO TABS: ORAL_TABLET | ORAL | 3 refills | Status: DC

## 2021-08-15 NOTE — Progress Notes (Signed)
HPI: FU dyspnea and palpitations.  Previously followed by Dr. Marijo File in [facility]. Stress echocardiogram October 2019 showed ejection fraction of 50% at rest that improved to 70% with exercise and no wall motion abnormalities noted.  Abdominal CT November 2021 showed no abdominal aortic aneurysm; there was note of an 8 mm nodule in the right lower lobe and follow-up recommended in 6 to 12 months.  Note he has not tolerated multiple antihypertensives including carvedilol, lisinopril, amlodipine, hydrochlorothiazide and olmesartan.  He has had difficulties with some of these medications causing lower extremity edema.  Calcium score August 2022 36 which was 26 percentile; multiple small pulmonary nodules stable compared to November 2021.  Follow-up recommended 12 months..  Since last seen, there is no chest pain, palpitations or syncope.  He has chronic dyspnea on exertion.  He has developed pain in his feet bilaterally particularly on the soles.  Also with some edema.  He attributes this to his labetalol.  Current Outpatient Medications  Medication Sig Dispense Refill   fluticasone (FLONASE) 50 MCG/ACT nasal spray Place 1 spray into the nose as directed.     insulin isophane & regular human KwikPen (NOVOLIN 70/30 KWIKPEN) (70-30) 100 UNIT/ML KwikPen 2 times per day 40 units and 50 units     labetalol (NORMODYNE) 100 MG tablet Take 1 tablet (100 mg total) by mouth 2 (two) times daily. 60 tablet 12   Lancets (FREESTYLE) lancets 2 per day     loratadine (CLARITIN) 10 MG tablet Take 10 mg by mouth as directed.     meloxicam (MOBIC) 7.5 MG tablet Take 15 mg by mouth as directed.     metFORMIN (GLUCOPHAGE) 500 MG tablet Take 500 mg by mouth in the morning and at bedtime.     mometasone (ELOCON) 0.1 % cream APPLY TOPICALLY TO THE AFFECTED AREA TWICE A DAY FOR UP TO 5 DAYS AS NEEDED FOR ITCHING     ramipril (ALTACE) 10 MG capsule Take 10 mg by mouth daily.     rosuvastatin (CRESTOR) 10 MG tablet Take  1 tablet (10 mg total) by mouth 3 (three) times a week.     No current facility-administered medications for this visit.     Past Medical History:  Diagnosis Date   Chronic kidney disease    Diabetes mellitus (HCC)    Diverticulosis    GERD (gastroesophageal reflux disease)    Hyperlipidemia    Hypertension    OSA (obstructive sleep apnea)    Palpitations    Squamous cell skin cancer, thigh     Past Surgical History:  Procedure Laterality Date   APPENDECTOMY     KNEE SURGERY     TONSILLECTOMY      Social History   Socioeconomic History   Marital status: Married    Spouse name: Not on file   Number of children: 1   Years of education: Not on file   Highest education level: Not on file  Occupational History   Not on file  Tobacco Use   Smoking status: Former    Types: Cigarettes   Smokeless tobacco: Former    Quit date: 08/15/1994  Substance and Sexual Activity   Alcohol use: Yes    Comment: 2 beers per day   Drug use: Never   Sexual activity: Yes    Partners: Female  Other Topics Concern   Not on file  Social History Narrative   Not on file   Social Determinants of Health  Financial Resource Strain: Not on file  Food Insecurity: Not on file  Transportation Needs: Not on file  Physical Activity: Not on file  Stress: Not on file  Social Connections: Not on file  Intimate Partner Violence: Not on file    Family History  Problem Relation Age of Onset   Heart attack Father     ROS: no fevers or chills, productive cough, hemoptysis, dysphasia, odynophagia, melena, hematochezia, dysuria, hematuria, rash, seizure activity, orthopnea, PND, claudication. Remaining systems are negative.  Physical Exam: Well-developed well-nourished in no acute distress.  Skin is warm and dry.  HEENT is normal.  Neck is supple.  Chest is clear to auscultation with normal expansion.  Cardiovascular exam is regular rate and rhythm.  Abdominal exam nontender or distended.  No masses palpated. Extremities show trace edema.  2+ dorsalis pedis pulses bilaterally. neuro grossly intact   A/P  1 hypertension-blood pressure is mildly elevated.  He stated it was improved at home but he has developed bilateral foot pain and edema that he feels may be secondary to labetalol.  We will discontinue.  Continue ramipril.  Add clonidine 0.1 mg daily.  If he tolerates we can advance as needed.  If not we could consider doxazosin or hydralazine in the future.  I will plan an echocardiogram to assess LV function.  2 hyperlipidemia-higher dose Crestor increase his glucose by report.  We will discontinue.  In 4 weeks begin pravastatin 40 mg 3 times weekly to see if he tolerates.  Check lipids and liver 8 weeks later.  3 lung nodule-plan follow-up CT August 2023.  4 coronary artery disease-based on minimally elevated calcium score.  Statin therapy as outlined above.  He is not having chest pain.  Kirk Ruths, MD

## 2021-08-15 NOTE — Telephone Encounter (Signed)
Pt c/o medication issue:  1. Name of Medication: Labetalol  2. How are you currently taking this medication (dosage and times per day)? 2 times a day  3. Are you having a reaction (difficulty breathing--STAT)? no  4. What is your medication issue? left foot a swollen and hurting, could hardly walk     Patient said he would like to talk to [facility] asap please about this

## 2021-08-15 NOTE — Telephone Encounter (Signed)
Returned call to pt. He report he's been experiencing swelling in his feet but more so in the left. He state they drove to the beach on Sunday and on Tuesday morning noticed the swelling. He doesn't believe symptoms is related to gout and report they are similar to when he had a reaction to a previous medication. He also report slight blurred vision and occasional itching. Pt is unsure if current symptoms could be related to the labetalol. He also report he's noticed an increase in BP x 1 week. Systolic ranging in the 959'D . This morning he report BP was 152/80 HR 66.    Pt also report he has stopped taking crestor as he believed medication increased blood sugar. He state since stopping medication, blood sugar has decreased.   Will forward to MD to make aware.

## 2021-08-15 NOTE — Patient Instructions (Signed)
Medication Instructions:   STOP LABETALOL  STOP ROSUVASTATIN  IN 4 WEEKS START PRAVASTATIN 40 MG ONE TABLET 3X WEEKLY  START CLONIDINE 0.1 MG ONCE DAILY  *If you need a refill on your cardiac medications before your next appointment, please call your pharmacy*   Lab Work:  Your physician recommends that you return for lab work in: [place] PRAVASTATIN  If you have labs (blood work) drawn today and your tests are completely normal, you will receive your results only by: [place] (if you have MyChart) OR A paper copy in the mail If you have any lab test that is abnormal or we need to change your treatment, we will call you to review the results.   Testing/Procedures:  Your physician has requested that you have an echocardiogram. Echocardiography is a painless test that uses sound waves to create images of your heart. It provides your doctor with information about the size and shape of your heart and how well your heart's chambers and valves are working. This procedure takes approximately one hour. There are no restrictions for this procedure. [place]   Follow-Up: At [facility], you and your health needs are our priority.  As part of our continuing mission to provide you with exceptional heart care, we have created designated Provider Care Teams.  These Care Teams include your primary Cardiologist (physician) and Advanced Practice Providers (APPs -  Physician Assistants and Nurse Practitioners) who all work together to provide you with the care you need, when you need it.  We recommend signing up for the patient portal called "MyChart".  Sign up information is provided on this After Visit Summary.  MyChart is used to connect with patients for Virtual Visits (Telemedicine).  Patients are able to view lab/test results, encounter notes, upcoming appointments, etc.  Non-urgent messages can be sent to your provider as well.   To learn more about what  you can do with MyChart, go to [email or web address].    Your next appointment:    AS SCHEDULED

## 2021-08-15 NOTE — Telephone Encounter (Signed)
Spoke with pt, he reports the furosemide is not going to work and does not want the prescription. He reports he is not able to walk on the left foot. Follow up scheduled for him to see dr Stanford Breed this afternoon.

## 2021-08-19 DIAGNOSIS — M10372 Gout due to renal impairment, left ankle and foot: Secondary | ICD-10-CM | POA: Diagnosis not present

## 2021-08-19 DIAGNOSIS — N183 Chronic kidney disease, stage 3 unspecified: Secondary | ICD-10-CM | POA: Diagnosis not present

## 2021-08-19 DIAGNOSIS — Z794 Long term (current) use of insulin: Secondary | ICD-10-CM | POA: Diagnosis not present

## 2021-08-19 DIAGNOSIS — E1122 Type 2 diabetes mellitus with diabetic chronic kidney disease: Secondary | ICD-10-CM | POA: Diagnosis not present

## 2021-08-19 DIAGNOSIS — E1142 Type 2 diabetes mellitus with diabetic polyneuropathy: Secondary | ICD-10-CM | POA: Diagnosis not present

## 2021-08-19 DIAGNOSIS — E119 Type 2 diabetes mellitus without complications: Secondary | ICD-10-CM | POA: Diagnosis not present

## 2021-08-19 DIAGNOSIS — I10 Essential (primary) hypertension: Secondary | ICD-10-CM | POA: Diagnosis not present

## 2021-08-23 ENCOUNTER — Other Ambulatory Visit: Payer: Self-pay

## 2021-08-23 ENCOUNTER — Ambulatory Visit (INDEPENDENT_AMBULATORY_CARE_PROVIDER_SITE_OTHER): Payer: PPO | Admitting: Orthopedic Surgery

## 2021-08-23 ENCOUNTER — Encounter: Payer: Self-pay | Admitting: Orthopedic Surgery

## 2021-08-23 DIAGNOSIS — M1A3720 Chronic gout due to renal impairment, left ankle and foot, without tophus (tophi): Secondary | ICD-10-CM | POA: Diagnosis not present

## 2021-08-23 MED ORDER — ALLOPURINOL 100 MG PO TABS: 100.0000 mg | ORAL_TABLET | Freq: Two times a day (BID) | ORAL | 3 refills | Status: DC

## 2021-08-23 MED ORDER — COLCHICINE 0.6 MG PO CAPS: 0.6000 mg | ORAL_CAPSULE | Freq: Two times a day (BID) | ORAL | 1 refills | Status: DC | PRN

## 2021-08-23 NOTE — Progress Notes (Signed)
Office Visit Note   Patient: Tim Finley           Date of Birth: [birth year]           MRN: [id number] Visit Date: 08/23/2021              Requested by: Manfred Shirts, [address] [place],  [place] [address] PCP: Manfred Shirts, [place]  Chief Complaint  Patient presents with   Left Foot - Follow-up      HPI: Patient is a 70 year old gentleman who was seen for initial evaluation for swelling and redness that occurs in the left foot and is also has symptoms in the right foot.  Patient is diabetic his hemoglobin A1c is 8.0.  Patient has had redness and swelling.  He states that his most recent episode of redness and swelling in the left foot occurred when he was on vacation and had shrimp 2 nights in a row.  Assessment & Plan: Visit Diagnoses: No diagnosis found.  Plan: We will allopurinol 100 mg twice a day and also provided prescription for colchicine.  He will take the colchicine twice a day during acute flareups and use the allopurinol as maintenance medicine.  At follow-up in 2 months we will repeat a uric acid level.  Patient will check with his pharmacist to ensure there are no interactions with his current medications.  Follow-Up Instructions: No follow-ups on file.   Ortho Exam  Patient is alert, oriented, no adenopathy, well-dressed, normal affect, normal respiratory effort. Examination patient has a good dorsalis pedis pulse bilaterally.  He has minimal swelling in either leg no brawny skin color changes no clinical signs of venous insufficiency.  He still has a little bit of swelling in the left foot which is tender to palpation.  Patient has a scar in the right leg from excision of a squamous cell cancer.  Patient's most recent uric acid on 05/31/2021 was 8.5.  Patient does have impaired renal function with a GFR of 59.  Hemoglobin A1c 8.0.  Imaging: No results found. No images are attached to the encounter.  Labs: No results found for: HGBA1C, ESRSEDRATE, CRP,  LABURIC, REPTSTATUS, GRAMSTAIN, CULT, LABORGA   No results found for: ALBUMIN, PREALBUMIN, CBC  No results found for: MG No results found for: VD25OH  No results found for: PREALBUMIN No flowsheet data found.   There is no height or weight on file to calculate BMI.  Orders:  No orders of the defined types were placed in this encounter.  No orders of the defined types were placed in this encounter.    Procedures: No procedures performed  Clinical Data: No additional findings.  ROS:  All other systems negative, except as noted in the HPI. Review of Systems  Objective: Vital Signs: There were no vitals taken for this visit.  Specialty Comments:  No specialty comments available.  PMFS History: There are no problems to display for this patient.  Past Medical History:  Diagnosis Date   Chronic kidney disease    Diabetes mellitus (HCC)    Diverticulosis    GERD (gastroesophageal reflux disease)    Hyperlipidemia    Hypertension    OSA (obstructive sleep apnea)    Palpitations    Squamous cell skin cancer, thigh     Family History  Problem Relation Age of Onset   Heart attack Father     Past Surgical History:  Procedure Laterality Date   APPENDECTOMY  KNEE SURGERY     TONSILLECTOMY     Social History   Occupational History   Not on file  Tobacco Use   Smoking status: Former    Types: Cigarettes   Smokeless tobacco: Former    Quit date: 08/15/1994  Substance and Sexual Activity   Alcohol use: Yes    Comment: 2 beers per day   Drug use: Never   Sexual activity: Yes    Partners: Female

## 2021-08-25 ENCOUNTER — Telehealth: Payer: Self-pay | Admitting: Cardiology

## 2021-08-25 NOTE — Telephone Encounter (Signed)
Patient called asking nurse Hilda Blades gives in a call back. Please advise

## 2021-08-25 NOTE — Telephone Encounter (Signed)
Returned call to patient-states he has been having issues with his feet (BL foot pain and edema reported at [place]).  Patient reports seeing Dr. Sharol Given yesterday and was diagnosed with gout.  He has been prescribed allopurinol 100 mg BID and colchicine 0.6 mg BID PRN.   He wanted to let Dr. Stanford Breed know and keep him "in the loop".  Also wanted to make sure he can take the colchicine (if needed) with the new cholesterol medication he is starting in 4 weeks (pravastatin).  He also is wondering if echocardiogram is still needed.    Advised will route to MD to review/make aware.

## 2021-08-26 NOTE — Telephone Encounter (Signed)
Spoke with pt, Aware of dr crenshaw's recommendations.  °

## 2021-09-05 ENCOUNTER — Other Ambulatory Visit: Payer: Self-pay

## 2021-09-05 ENCOUNTER — Ambulatory Visit (HOSPITAL_BASED_OUTPATIENT_CLINIC_OR_DEPARTMENT_OTHER)
Admission: RE | Admit: 2021-09-05 | Discharge: 2021-09-05 | Disposition: A | Discharge status: Home / Self Care | Payer: PPO | Source: Ambulatory Visit | Attending: Cardiology | Admitting: Cardiology

## 2021-09-05 DIAGNOSIS — I10 Essential (primary) hypertension: Secondary | ICD-10-CM | POA: Diagnosis not present

## 2021-09-05 LAB — ECHOCARDIOGRAM COMPLETE
AR max vel: 2.36 cm2
AV Area VTI: 2.25 cm2
AV Area mean vel: 2.2 cm2
AV Mean grad: 4 mmHg
AV Peak grad: 6.9 mmHg
Ao pk vel: 1.31 m/s
Area-P 1/2: 4.01 cm2
Calc EF: 57.7 %
S' Lateral: 3.4 cm
Single Plane A2C EF: 57.4 %
Single Plane A4C EF: 60.3 %

## 2021-09-07 ENCOUNTER — Encounter: Payer: Self-pay | Admitting: *Deleted

## 2021-09-09 ENCOUNTER — Other Ambulatory Visit: Payer: Self-pay

## 2021-09-09 ENCOUNTER — Encounter (HOSPITAL_BASED_OUTPATIENT_CLINIC_OR_DEPARTMENT_OTHER): Payer: Self-pay | Admitting: *Deleted

## 2021-09-09 ENCOUNTER — Emergency Department (HOSPITAL_BASED_OUTPATIENT_CLINIC_OR_DEPARTMENT_OTHER)
Admission: EM | Admit: 2021-09-09 | Discharge: 2021-09-09 | Disposition: A | Discharge status: Home / Self Care | Payer: PPO | Attending: Emergency Medicine | Admitting: Emergency Medicine

## 2021-09-09 ENCOUNTER — Emergency Department (HOSPITAL_BASED_OUTPATIENT_CLINIC_OR_DEPARTMENT_OTHER): Payer: PPO

## 2021-09-09 DIAGNOSIS — N189 Chronic kidney disease, unspecified: Secondary | ICD-10-CM | POA: Diagnosis not present

## 2021-09-09 DIAGNOSIS — Z87891 Personal history of nicotine dependence: Secondary | ICD-10-CM | POA: Insufficient documentation

## 2021-09-09 DIAGNOSIS — Z79899 Other long term (current) drug therapy: Secondary | ICD-10-CM | POA: Insufficient documentation

## 2021-09-09 DIAGNOSIS — E119 Type 2 diabetes mellitus without complications: Secondary | ICD-10-CM | POA: Insufficient documentation

## 2021-09-09 DIAGNOSIS — Z85828 Personal history of other malignant neoplasm of skin: Secondary | ICD-10-CM | POA: Insufficient documentation

## 2021-09-09 DIAGNOSIS — M79672 Pain in left foot: Secondary | ICD-10-CM | POA: Diagnosis not present

## 2021-09-09 DIAGNOSIS — Z794 Long term (current) use of insulin: Secondary | ICD-10-CM | POA: Insufficient documentation

## 2021-09-09 DIAGNOSIS — I129 Hypertensive chronic kidney disease with stage 1 through stage 4 chronic kidney disease, or unspecified chronic kidney disease: Secondary | ICD-10-CM | POA: Diagnosis not present

## 2021-09-09 DIAGNOSIS — M7989 Other specified soft tissue disorders: Secondary | ICD-10-CM | POA: Insufficient documentation

## 2021-09-09 DIAGNOSIS — Z7984 Long term (current) use of oral hypoglycemic drugs: Secondary | ICD-10-CM | POA: Insufficient documentation

## 2021-09-09 HISTORY — DX: Gout, unspecified: M10.9

## 2021-09-09 LAB — BASIC METABOLIC PANEL
Anion gap: 11 (ref 5–15)
BUN: 25 mg/dL — ABNORMAL HIGH (ref 8–23)
CO2: 24 mmol/L (ref 22–32)
Calcium: 9.5 mg/dL (ref 8.9–10.3)
Chloride: 103 mmol/L (ref 98–111)
Creatinine, Ser: 1.44 mg/dL — ABNORMAL HIGH (ref 0.61–1.24)
GFR, Estimated: 52 mL/min — ABNORMAL LOW (ref >60)
Glucose, Bld: 218 mg/dL — ABNORMAL HIGH (ref 70–99)
Potassium: 4.4 mmol/L (ref 3.5–5.1)
Sodium: 138 mmol/L (ref 135–145)

## 2021-09-09 LAB — CBC WITH DIFFERENTIAL/PLATELET
Abs Immature Granulocytes: 0.06 10*3/uL (ref 0.00–0.07)
Basophils Absolute: 0.1 10*3/uL (ref 0.0–0.1)
Basophils Relative: 1 %
Eosinophils Absolute: 0.2 10*3/uL (ref 0.0–0.5)
Eosinophils Relative: 2 %
HCT: 46.4 % (ref 39.0–52.0)
Hemoglobin: 15.9 g/dL (ref 13.0–17.0)
Immature Granulocytes: 0 %
Lymphocytes Relative: 15 %
Lymphs Abs: 2 10*3/uL (ref 0.7–4.0)
MCH: 29.4 pg (ref 26.0–34.0)
MCHC: 34.3 g/dL (ref 30.0–36.0)
MCV: 85.9 fL (ref 80.0–100.0)
Monocytes Absolute: 1.2 10*3/uL — ABNORMAL HIGH (ref 0.1–1.0)
Monocytes Relative: 9 %
Neutro Abs: 9.8 10*3/uL — ABNORMAL HIGH (ref 1.7–7.7)
Neutrophils Relative %: 73 %
Platelets: 259 10*3/uL (ref 150–400)
RBC: 5.4 MIL/uL (ref 4.22–5.81)
RDW: 12.6 % (ref 11.5–15.5)
WBC: 13.3 10*3/uL — ABNORMAL HIGH (ref 4.0–10.5)
nRBC: 0 % (ref 0.0–0.2)

## 2021-09-09 MED ORDER — PREDNISONE 10 MG PO TABS: 30.0000 mg | ORAL_TABLET | Freq: Every day | ORAL | 0 refills | Status: AC

## 2021-09-09 NOTE — ED Notes (Signed)
Taken to US at this time. 

## 2021-09-09 NOTE — ED Triage Notes (Signed)
Pain, redness and swelling to his left foot. He is being treated for gout with Allopurinol and Colchicine.

## 2021-09-09 NOTE — ED Provider Notes (Signed)
[facility] EMERGENCY DEPARTMENT Provider Note   CSN: [id number] Arrival date & time: 09/09/21  1124     History Chief Complaint  Patient presents with   Foot Pain    EDWORD CU is a 70 y.o. male.  HPI  Patient with significant medical history of CKD, diabetes, GERD, gout, hyperlipidemia, hypertension presents to the emergency for chief complaints of left foot pain.  Patient states he has been having left foot pain for the last week, started on Wednesday, and has remained constant, he is describes it as a dull-like sensation, remains on the top of his foot, does not radiate, denies any paresthesia or weakness in his foot, no associated trauma to the area.  States that he has some slight swelling and redness to the area states this feels typical of his gout flareups, he is currently on allopurinol has been taking this but  stopped this three days ago and started to take his colchicine which does not seem to help.  He has no history of IV drug use, denies fevers, chills, chest pain, shortness of breath, peripheral edema, he denies any scratches or abrasions of the foot, denies any alleviating factors.  Past Medical History:  Diagnosis Date   Chronic kidney disease    Diabetes mellitus (HCC)    Diverticulosis    GERD (gastroesophageal reflux disease)    Gout    Hyperlipidemia    Hypertension    OSA (obstructive sleep apnea)    Palpitations    Squamous cell skin cancer, thigh     There are no problems to display for this patient.   Past Surgical History:  Procedure Laterality Date   APPENDECTOMY     KNEE SURGERY     TONSILLECTOMY         Family History  Problem Relation Age of Onset   Heart attack Father     Social History   Tobacco Use   Smoking status: Former    Types: Cigarettes   Smokeless tobacco: Former    Quit date: 08/15/1994  Substance Use Topics   Alcohol use: Yes    Comment: 2 beers per day   Drug use: Never    Home  Medications Prior to Admission medications   Medication Sig Start Date End Date Taking? Authorizing Provider  predniSONE (DELTASONE) 10 MG tablet Take 3 tablets (30 mg total) by mouth daily for 5 days. 09/09/21 09/14/21 Yes Marcello Fennel, PA-C  allopurinol (ZYLOPRIM) 100 MG tablet Take 1 tablet (100 mg total) by mouth 2 (two) times daily. 08/23/21   Newt Minion, MD  cloNIDine (CATAPRES) 0.1 MG tablet Take 1 tablet (0.1 mg total) by mouth daily. 08/15/21   Lelon Perla, MD  Colchicine 0.6 MG CAPS Take 0.6 mg by mouth 2 (two) times daily as needed. 08/23/21   Newt Minion, MD  fluticasone (FLONASE) 50 MCG/ACT nasal spray Place 1 spray into the nose as directed.    [provider]  insulin isophane & regular human KwikPen (NOVOLIN 70/30 KWIKPEN) (70-30) 100 UNIT/ML KwikPen 2 times per day 40 units and 50 units 12/20/20   [provider]  Lancets (FREESTYLE) lancets 2 per day 12/20/15   [provider]  loratadine (CLARITIN) 10 MG tablet Take 10 mg by mouth as directed. 03/23/21   [provider]  meloxicam (MOBIC) 7.5 MG tablet Take 15 mg by mouth as directed. 02/08/16   [provider]  metFORMIN (GLUCOPHAGE) 500 MG tablet Take 500 mg  by mouth in the morning and at bedtime. 03/23/21   [provider]  mometasone (ELOCON) 0.1 % cream APPLY TOPICALLY TO THE AFFECTED AREA TWICE A DAY FOR UP TO 5 DAYS AS NEEDED FOR ITCHING 11/09/20   [provider]  pravastatin (PRAVACHOL) 40 MG tablet Take 1 tablet 3x weekly 08/15/21   Lelon Perla, MD  ramipril (ALTACE) 10 MG capsule Take 10 mg by mouth daily. 04/23/17   [provider]    Allergies    Insulin detemir, Liraglutide, Metoprolol, Glipizide, Lisinopril, and Olmesartan  Review of Systems   Review of Systems  Constitutional:  Negative for chills and fever.  HENT:  Negative for congestion.   Respiratory:  Negative for shortness of breath.   Cardiovascular:  Negative for  chest pain, palpitations and leg swelling.  Gastrointestinal:  Negative for abdominal pain.  Genitourinary:  Negative for enuresis.  Musculoskeletal:  Negative for back pain.       Left foot pain.  Skin:  Negative for rash.       Redness swelling around the left foot.  Neurological:  Negative for dizziness.  Hematological:  Does not bruise/bleed easily.   Physical Exam Updated Vital Signs BP (!) 153/88 (BP Location: Right Arm)   Pulse 99   Temp 98.3 F (36.8 C) (Oral)   Resp 17   Ht 6\' 1"  (1.854 m)   Wt 107.5 kg   SpO2 95%   BMI 31.27 kg/m   Physical Exam Vitals and nursing note reviewed.  Constitutional:      General: He is not in acute distress.    Appearance: He is not ill-appearing.  HENT:     Head: Normocephalic and atraumatic.     Nose: No congestion.  Eyes:     Conjunctiva/sclera: Conjunctivae normal.  Cardiovascular:     Rate and Rhythm: Normal rate and regular rhythm.     Pulses: Normal pulses.     Heart sounds: No murmur heard.   No friction rub. No gallop.  Pulmonary:     Effort: No respiratory distress.     Breath sounds: No wheezing, rhonchi or rales.  Musculoskeletal:     Comments: Left foot was visualized slight pedal edema present, with overlying erythema seen on the dorsum of the foot, he has full range of motion all toes and ankle, neurovascular is fully intact.  He is tender to palpation along the midfoot, no crepitus or deformities present.  There is no evidence of abrasions or lacerations, erythema is localized just to the top of the foot.  There is no evidence of unilateral leg swelling, he was slightly tender to palpation on the posterior aspect of his left calf, no palpable cords noted.  Skin:    General: Skin is warm and dry.  Neurological:     Mental Status: He is alert.  Psychiatric:        Mood and Affect: Mood normal.    ED Results / Procedures / Treatments   Labs (all labs ordered are listed, but only abnormal results are  displayed) Labs Reviewed  BASIC METABOLIC PANEL - Abnormal; Notable for the following components:      Result Value   Glucose, Bld 218 (*)    BUN 25 (*)    Creatinine, Ser 1.44 (*)    GFR, Estimated 52 (*)    All other components within normal limits  CBC WITH DIFFERENTIAL/PLATELET - Abnormal; Notable for the following components:   WBC 13.3 (*)    Neutro  Abs 9.8 (*)    Monocytes Absolute 1.2 (*)    All other components within normal limits    EKG None  Radiology US Venous Img Lower Unilateral Left  Result Date: 09/09/2021 CLINICAL DATA:  Left leg swelling for 1 month. EXAM: LEFT LOWER EXTREMITY VENOUS DOPPLER ULTRASOUND TECHNIQUE: Gray-scale sonography with compression, as well as color and duplex ultrasound, were performed to evaluate the deep venous system(s) from the level of the common femoral vein through the popliteal and proximal calf veins. COMPARISON:  None. FINDINGS: VENOUS Normal compressibility of the common femoral, superficial femoral, and popliteal veins, as well as the visualized calf veins. Visualized portions of profunda femoral vein and great saphenous vein unremarkable. No filling defects to suggest DVT on grayscale or color Doppler imaging. Doppler waveforms show normal direction of venous flow, normal respiratory plasticity and response to augmentation. Limited views of the contralateral common femoral vein are unremarkable. OTHER Left inguinal lymph node measures just over 1 cm short axis, but has a thin cortex and has a large amount of hilar fat consistent with a reactive/normal lymph node. Limitations: none IMPRESSION: No evidence of a left lower extremity deep venous thrombosis. Electronically Signed   By: Lajean Manes M.D.   On: 09/09/2021 15:28    Procedures Procedures   Medications Ordered in ED Medications - No data to display  ED Course  I have reviewed the triage vital signs and the nursing notes.  Pertinent labs & imaging results that were  available during my care of the patient were reviewed by me and considered in my medical decision making (see chart for details).    MDM Rules/Calculators/A&P                          Initial impression-presents with left Foot pain alert, no acute distress, vital signs reassuring.  Likely a gout flareup, I am concerned for possible DVT a does have calf tenderness will obtain DVT study, and basic lab work to check creatinine functions, and reassess.  Work-up-CBC shows slight leukocytosis of 13.3, BMP shows slight increase in glucose of 218, BUN of 25, slight increase creatinine 1.44 GFR 52, baseline appears to be 1.2.   Rule out- I have low suspicion for septic arthritis as patient denies IV drug use, no new heart murmur heard on exam.  Patient does have noted overlying skin changes as well as a slightly elevated CBC, but there is no signs of skin abrasions, presentation is more consistent with gout as he has a history of this, erythema is just on the dorsum of the foot consistent with gout, states this feels like his typical gout flareups.  Leukocytosis I feel is more an acute phase reactants, do not feel this is infectious at this time.  Low suspicion for fracture or dislocation, there is no gross deformities present, imaging was deferred there is no associated trauma with this pain. low suspicion for ligament or tendon damage as area was palpated no gross defects noted, they had full range of motion as well as 5/5 strength.  Low suspicion for compartment syndrome as area was palpated it was soft to the touch, neurovascular fully intact.   Plan-  Left foot pain-likely an exacerbation of gout, will have him continue with colchicine as he does not have severe AKI, will start him on steroids.  Advised him to follow-up with his primary care doctor have him recheck his creatinine functions, educated on nephrotoxicity medications, explaining that  if symptoms do not improving after a few days of steroids  and colchicine use like come back for reevaluation  Vital signs have remained stable, no indication for hospital admission.  Patient discussed with attending and they agreed with assessment and plan.  Patient given at home care as well strict return precautions.  Patient verbalized that they understood agreed to said plan.  Final Clinical Impression(s) / ED Diagnoses Final diagnoses:  Foot pain, left    Rx / DC Orders ED Discharge Orders          Ordered    predniSONE (DELTASONE) 10 MG tablet  Daily        09/09/21 1603             Aron Baba 09/09/21 1655    Lucrezia Starch, MD 09/10/21 [id number]

## 2021-09-09 NOTE — ED Notes (Signed)
Pt. Has noted edema and redness in the L foot and has beeb Dx with gout on 08/23/21 by Ortho with [facility].

## 2021-09-09 NOTE — Discharge Instructions (Addendum)
Likely you have a gout flareup, I want you to continue with the colchicine, I have started you on steroids please take as prescribed.  Once the flareup has subsided please stop taking the colchicine and start taking your allopurinol.  It was noted that your creatinine was slightly elevated today please make sure when you see your primary care doctor so that they recheck your creatinine levels.  If after couple days of steroids and colchicine your pain is not getting better or is getting worse possibly there could be an infection I would like to come back to the emergency department or speak with your primary care provider.  Come back to the emergency department if you develop chest pain, shortness of breath, severe abdominal pain, uncontrolled nausea, vomiting, diarrhea.

## 2021-09-15 DIAGNOSIS — I129 Hypertensive chronic kidney disease with stage 1 through stage 4 chronic kidney disease, or unspecified chronic kidney disease: Secondary | ICD-10-CM | POA: Diagnosis not present

## 2021-09-15 DIAGNOSIS — N183 Chronic kidney disease, stage 3 unspecified: Secondary | ICD-10-CM | POA: Diagnosis not present

## 2021-09-15 DIAGNOSIS — M1A0790 Idiopathic chronic gout, unspecified ankle and foot, without tophus (tophi): Secondary | ICD-10-CM | POA: Diagnosis not present

## 2021-09-15 DIAGNOSIS — Z794 Long term (current) use of insulin: Secondary | ICD-10-CM | POA: Diagnosis not present

## 2021-09-15 DIAGNOSIS — I10 Essential (primary) hypertension: Secondary | ICD-10-CM | POA: Diagnosis not present

## 2021-09-15 DIAGNOSIS — E1122 Type 2 diabetes mellitus with diabetic chronic kidney disease: Secondary | ICD-10-CM | POA: Diagnosis not present

## 2021-09-20 ENCOUNTER — Telehealth: Payer: Self-pay | Admitting: Cardiology

## 2021-09-20 NOTE — Telephone Encounter (Signed)
Returned call to patient of Dr. Stanford Breed  He was in ER on 11/25 - gout in left foot - started 5 days of steroids He saw PCP after this - PCP recommended he wait until first of the year to start pravastatin since he has had this gout flare up   He had labs with PCP last week but has not done labs for our office   Advised to complete lipid/LFT 8 weeks after starting pravastatin and will notify MD as Juluis Rainier

## 2021-09-20 NOTE — Telephone Encounter (Signed)
Pt c/o medication issue:  1. Name of Medication:    2. How are you currently taking this medication (dosage and times per day)?    3. Are you having a reaction (difficulty breathing--STAT)?  no  4. What is your medication issue? Patient is asking the nurse give him a call, his pcp wants him to hold off on taking medication. Patient would like to update nurse on it.

## 2021-09-27 ENCOUNTER — Telehealth: Payer: Self-pay | Admitting: Orthopedic Surgery

## 2021-09-27 NOTE — Telephone Encounter (Signed)
Pt states the medication that he has been taking for gout is making him feel sick and sweat. Also he states his blood sugar has been going up. allopurinol (ZYLOPRIM) 100 MG tablet [[id number]]  CB [phone]

## 2021-09-27 NOTE — Telephone Encounter (Signed)
Pt was evaluated in the office 08/23/21 for left foot gout. Started on allopurinol. Calls today and states that the medication makes him "sweat, feel sick and his BS are elevated" pt saw his PCP 09/15/21 Uric acid level was 8.0  this is down from 8.5 in August what would you like for him to do?

## 2021-09-28 ENCOUNTER — Other Ambulatory Visit: Payer: Self-pay | Admitting: Orthopedic Surgery

## 2021-09-28 MED ORDER — FEBUXOSTAT 40 MG PO TABS: 40.0000 mg | ORAL_TABLET | Freq: Every day | ORAL | 2 refills | Status: DC

## 2021-09-28 NOTE — Telephone Encounter (Signed)
Can you please call thanks!

## 2021-09-28 NOTE — Telephone Encounter (Signed)
Rx sent, pt informed. 

## 2021-10-25 ENCOUNTER — Ambulatory Visit: Payer: PPO | Admitting: Orthopedic Surgery

## 2021-10-25 DIAGNOSIS — M1A3720 Chronic gout due to renal impairment, left ankle and foot, without tophus (tophi): Secondary | ICD-10-CM

## 2021-11-05 ENCOUNTER — Emergency Department (HOSPITAL_BASED_OUTPATIENT_CLINIC_OR_DEPARTMENT_OTHER)
Admission: EM | Admit: 2021-11-05 | Discharge: 2021-11-05 | Disposition: A | Discharge status: Home / Self Care | Payer: PPO | Attending: Emergency Medicine | Admitting: Emergency Medicine

## 2021-11-05 ENCOUNTER — Other Ambulatory Visit: Payer: Self-pay

## 2021-11-05 ENCOUNTER — Encounter (HOSPITAL_BASED_OUTPATIENT_CLINIC_OR_DEPARTMENT_OTHER): Payer: Self-pay

## 2021-11-05 DIAGNOSIS — Z794 Long term (current) use of insulin: Secondary | ICD-10-CM | POA: Insufficient documentation

## 2021-11-05 DIAGNOSIS — R220 Localized swelling, mass and lump, head: Secondary | ICD-10-CM

## 2021-11-05 DIAGNOSIS — T7840XA Allergy, unspecified, initial encounter: Secondary | ICD-10-CM | POA: Diagnosis not present

## 2021-11-05 DIAGNOSIS — H1013 Acute atopic conjunctivitis, bilateral: Secondary | ICD-10-CM | POA: Diagnosis not present

## 2021-11-05 MED ORDER — DIPHENHYDRAMINE HCL 50 MG/ML IJ SOLN
25.0000 mg | Freq: Once | INTRAMUSCULAR | Status: AC
  Administered 2021-11-05: 25 mg via INTRAVENOUS
  Filled 2021-11-05: qty 1

## 2021-11-05 MED ORDER — SODIUM CHLORIDE 0.9 % IV BOLUS
1000.0000 mL | Freq: Once | INTRAVENOUS | Status: AC
  Administered 2021-11-05: 1000 mL via INTRAVENOUS

## 2021-11-05 MED ORDER — FAMOTIDINE IN NACL 20-0.9 MG/50ML-% IV SOLN
20.0000 mg | Freq: Once | INTRAVENOUS | Status: AC
Start: 2021-11-05 — End: 2021-11-05
  Administered 2021-11-05: 20 mg via INTRAVENOUS
  Filled 2021-11-05: qty 50

## 2021-11-05 MED ORDER — FAMOTIDINE 20 MG PO TABS
20.0000 mg | ORAL_TABLET | Freq: Two times a day (BID) | ORAL | 0 refills | Status: DC
Start: 2021-11-05 — End: 2022-06-07

## 2021-11-05 NOTE — ED Triage Notes (Signed)
PT reports redness, drainage, itching and swelling to bilateral eyes that has been going on since last Sunday but worsened yesterday. Pt believes it could be a reaction to prednisone or a new gout medication that he recently started. Pt is tachycardic during triage, denies chest pain or any difficulty breathing.

## 2021-11-05 NOTE — Discharge Instructions (Addendum)
Take pepcid 20 mg twice daily for seven days Take Claritin or Benadryl once daily for seven days Stop prednisone Consider stopping Febuxostat if symptoms do not improve in 7 days.    Adverse Reactions of Febuxostat: The following adverse drug reactions and incidences are derived from product labeling unless otherwise specified. Adverse reactions reported in adults. 1% to 10%: Dermatologic: Skin rash (2%) Gastrointestinal: Nausea (1%) Hepatic: Hepatic insufficiency (5% to 7%), increased serum alanine aminotransferase (3%), increased serum aspartate aminotransferase (2%) Neuromuscular & skeletal: Arthralgia (?1%) <1%: Cardiovascular: Angina pectoris, atrial fibrillation, atrial flutter, chest pain, ECG abnormality, edema, flushing, heart murmur, hypertension, hypotension, increased serum creatine kinase, palpitations, sinus bradycardia, tachycardia Dermatologic: Abnormal skin odor, alopecia, dermatitis, ecchymoses, eczema, exfoliation of skin, hair discoloration, hyperhidrosis, pruritus, skin discoloration, skin lesion, skin photosensitivity, urticaria (including dermographism) Endocrine & metabolic: Decreased libido, decreased serum bicarbonate, dehydration, diabetes mellitus, gynecomastia, hirsutism, hot flash, hypercholesterolemia, hyperglycemia, hypertriglyceridemia, hypokalemia, increased lactate dehydrogenase, increased LDL cholesterol, increased serum potassium, increased serum sodium, increased thirst, increased thyroid stimulating hormone level, weight gain, weight loss Gastrointestinal: Abdominal distention, abdominal pain, anorexia, cholecystitis, cholelithiasis, constipation, decreased appetite, dysgeusia, dyspepsia, flatulence, frequent bowel movements, gastric hyperacidity, gastritis, gastroesophageal reflux disease, gastrointestinal distress, gingival pain, hematemesis, hematochezia, increased amylase, increased appetite, oral mucosa ulcer, pancreatitis, peptic ulcer, vomiting,  xerostomia Genitourinary: Decreased urine output, erectile dysfunction, hematuria, increased urine output, mastalgia, pollakiuria, prostate specific antigen increase, proteinuria, urinary incontinence, urinary urgency Hematologic & oncologic: Anemia, blood coagulation test abnormality, bruise, immune thrombocytopenia, increased MCV, leukocytosis, leukocyturia, leukopenia, lymphocytopenia, neutropenia, pancytopenia, petechia, prolonged partial thromboplastin time, prolonged prothrombin time, purpuric disease, splenomegaly, thrombocytopenia Hepatic: Hepatitis, hepatomegaly, increased serum alkaline phosphatase, liver steatosis Hypersensitivity: Angioedema, hypersensitivity reaction Infection: Herpes zoster infection Nervous system: Abnormal electroencephalogram, abnormal gait, agitation, altered sense of smell (decreased), anxiety, asthenia, balance impairment, cerebral infarction (lacunar), cerebrovascular accident, decreased mental acuity, depression, drowsiness, fatigue, feeling abnormal, Guillain-Barr syndrome, headache, hemiparesis, hypertonia, hypoesthesia, insomnia, irritability, lethargy, migraine, myasthenia, nervousness, pain, panic attack, paresthesia, personality changes, transient ischemic attacks, tremor, vertigo Neuromuscular & skeletal: Arthritis, increased creatine phosphokinase in blood specimen, joint stiffness, joint swelling, muscle rigidity, muscle spasm, muscle twitching, musculoskeletal pain, myalgia Ophthalmic: Blurred vision Otic: Deafness, tinnitus Renal: Casts in urine, increased blood urea nitrogen, increased serum creatinine, nephrolithiasis, renal failure syndrome Respiratory: Bronchitis, cough, dry nose, dyspnea, epistaxis, flu-like symptoms, paranasal sinus hypersecretion, pharyngeal edema, respiratory congestion, sneezing, throat irritation, upper respiratory tract infection

## 2021-11-05 NOTE — ED Provider Notes (Signed)
[facility] Provider Note   CSN: [id number] Arrival date & time: 11/05/21  1448     History  Chief Complaint  Patient presents with   Eye Problem    Tim Finley is a 71 y.o. male.  Pt is a 71 yo male presenting for allergic reaction. Pt admits to starting prednisone on Tuesday of this week, approx 4 days ago for gout infection when he developed bilateral eye redness and periocular swelling. Denies ocular pain. Denies changes in vision. Admits to occular itching.  Denies injury to eyes or foreign body sensation. Denies recent URI of fever.   The history is provided by the patient. No language interpreter was used.  Eye Problem Associated symptoms: discharge, itching and redness   Associated symptoms: no vomiting       Home Medications Prior to Admission medications   Medication Sig Start Date End Date Taking? Authorizing Provider  allopurinol (ZYLOPRIM) 100 MG tablet Take 1 tablet (100 mg total) by mouth 2 (two) times daily. 08/23/21   Newt Minion, MD  cloNIDine (CATAPRES) 0.1 MG tablet Take 1 tablet (0.1 mg total) by mouth daily. 08/15/21   Lelon Perla, MD  Colchicine 0.6 MG CAPS Take 0.6 mg by mouth 2 (two) times daily as needed. 08/23/21   Newt Minion, MD  febuxostat (ULORIC) 40 MG tablet Take 1 tablet (40 mg total) by mouth daily. 09/28/21   Newt Minion, MD  fluticasone (FLONASE) 50 MCG/ACT nasal spray Place 1 spray into the nose as directed.    [provider]  insulin isophane & regular human KwikPen (NOVOLIN 70/30 KWIKPEN) (70-30) 100 UNIT/ML KwikPen 2 times per day 40 units and 50 units 12/20/20   [provider]  Lancets (FREESTYLE) lancets 2 per day 12/20/15   [provider]  loratadine (CLARITIN) 10 MG tablet Take 10 mg by mouth as directed. 03/23/21   [provider]  meloxicam (MOBIC) 7.5 MG tablet Take 15 mg by mouth as directed. 02/08/16   [provider]  metFORMIN (GLUCOPHAGE)  500 MG tablet Take 500 mg by mouth in the morning and at bedtime. 03/23/21   [provider]  mometasone (ELOCON) 0.1 % cream APPLY TOPICALLY TO THE AFFECTED AREA TWICE A DAY FOR UP TO 5 DAYS AS NEEDED FOR ITCHING 11/09/20   [provider]  pravastatin (PRAVACHOL) 40 MG tablet Take 1 tablet 3x weekly 08/15/21   Lelon Perla, MD  ramipril (ALTACE) 10 MG capsule Take 10 mg by mouth daily. 04/23/17   [provider]      Allergies    Insulin detemir, Liraglutide, Metoprolol, Glipizide, Lisinopril, and Olmesartan    Review of Systems   Review of Systems  Constitutional:  Negative for chills and fever.  HENT:  Negative for ear pain and sore throat.   Eyes:  Positive for discharge, redness and itching. Negative for pain and visual disturbance.  Respiratory:  Negative for cough and shortness of breath.   Cardiovascular:  Negative for chest pain and palpitations.  Gastrointestinal:  Negative for abdominal pain and vomiting.  Genitourinary:  Negative for dysuria and hematuria.  Musculoskeletal:  Negative for arthralgias and back pain.  Skin:  Negative for color change and rash.  Neurological:  Negative for seizures and syncope.  All other systems reviewed and are negative.  Physical Exam Updated Vital Signs BP (!) 148/92 (BP Location: Right Arm)    Pulse (!) 126    Temp 98.6 F (  37 C) (Oral)    Resp 20    SpO2 96%  Physical Exam Vitals and nursing note reviewed.  Constitutional:      General: He is not in acute distress.    Appearance: He is well-developed.  HENT:     Head: Normocephalic and atraumatic.   Eyes:     General: Vision grossly intact.     Extraocular Movements: Extraocular movements intact.     Conjunctiva/sclera: Conjunctivae normal.     Pupils: Pupils are equal, round, and reactive to light.     Comments: No pain with EOM  Cardiovascular:     Rate and Rhythm: Normal rate and regular rhythm.     Heart sounds: No murmur heard. Pulmonary:      Effort: Pulmonary effort is normal. No respiratory distress.     Breath sounds: Normal breath sounds.  Abdominal:     Palpations: Abdomen is soft.     Tenderness: There is no abdominal tenderness.  Musculoskeletal:        General: No swelling.     Cervical back: Neck supple.  Skin:    General: Skin is warm and dry.     Capillary Refill: Capillary refill takes less than 2 seconds.  Neurological:     Mental Status: He is alert.  Psychiatric:        Mood and Affect: Mood normal.    ED Results / Procedures / Treatments   Labs (all labs ordered are listed, but only abnormal results are displayed) Labs Reviewed - No data to display  EKG None  Radiology No results found.  Procedures Procedures    Medications Ordered in ED Medications  sodium chloride 0.9 % bolus 1,000 mL (has no administration in time range)  diphenhydrAMINE (BENADRYL) injection 25 mg (has no administration in time range)  famotidine (PEPCID) IVPB 20 mg premix (has no administration in time range)    ED Course/ Medical Decision Making/ A&P                           Medical Decision Making Risk Prescription drug management.   3:37 PM 71 yo male presenting for allergic reaction. Pt is Aox3, no acute distress, afebrile, with stable vitals. Physical exam demonstrates periorbital edema and injected conjunctive. PERRL. No vision changes. Nml EOM with no associated pain.   Pt recently started taking both prednisone and febuxostat or gout directly prior to onset of swelling. Recommendations to stop treatments at this time and follow up with pcp for gout control. Patient recommended for benadryl or Claritin daily as well as pepcid. No signs of anaphylaxis.   Patient in no distress and overall condition improved here in the ED. Detailed discussions were had with the patient regarding current findings, and need for close f/u with PCP or on call doctor. The patient has been instructed to return immediately if the  symptoms worsen in any way for re-evaluation. Patient verbalized understanding and is in agreement with current care plan. All questions answered prior to discharge.         Final Clinical Impression(s) / ED Diagnoses Final diagnoses:  Allergic conjunctivitis of both eyes  Facial swelling  Allergic reaction, initial encounter    Rx / DC Orders ED Discharge Orders     None         Lianne Cure, DO 79/02/40 1749

## 2021-11-10 ENCOUNTER — Ambulatory Visit: Payer: PPO | Admitting: Cardiology

## 2021-11-10 ENCOUNTER — Telehealth: Payer: Self-pay | Admitting: Cardiology

## 2021-11-10 ENCOUNTER — Other Ambulatory Visit: Payer: Self-pay

## 2021-11-10 ENCOUNTER — Encounter: Payer: Self-pay | Admitting: Cardiology

## 2021-11-10 VITALS — BP 120/80 | HR 133 | Ht 73.0 in | Wt 227.8 lb

## 2021-11-10 DIAGNOSIS — E118 Type 2 diabetes mellitus with unspecified complications: Secondary | ICD-10-CM | POA: Diagnosis not present

## 2021-11-10 DIAGNOSIS — R000 Tachycardia, unspecified: Secondary | ICD-10-CM | POA: Diagnosis not present

## 2021-11-10 DIAGNOSIS — I10 Essential (primary) hypertension: Secondary | ICD-10-CM | POA: Diagnosis not present

## 2021-11-10 DIAGNOSIS — I4510 Unspecified right bundle-branch block: Secondary | ICD-10-CM

## 2021-11-10 DIAGNOSIS — I2510 Atherosclerotic heart disease of native coronary artery without angina pectoris: Secondary | ICD-10-CM | POA: Diagnosis not present

## 2021-11-10 NOTE — Telephone Encounter (Signed)
Jasmine at Dr Kellie Simmering office she states that Dr Greggory Stallion will not let pt leave the office until he "gets the ok from [place]" pt HR is 130-135 BP 142/82. Per Delana Meyer she states that pt denies any symptoms. No palpitations. Per jasmine she states that Dr Greggory Stallion "could not hear if there were any other irregular HR because the HR was too fast to hear anything". Per Delana Meyer pt stopped Labetalol "quite a while ago" but he is taking the Ramipril. Per [place], he states that he is taking "all of his other medications. Upon review of the last note here with Dr Stanford Breed, he was to stop the labetalol. And was to start clonidine 0.1mg  daily. Per Dr Harriet Masson add on to my schedule today. Pt stated that he "would be right over" scheduled appt at 130pm. Dr Harriet Masson nurse informed. [place] desk notified.

## 2021-11-10 NOTE — Progress Notes (Signed)
Cardiology Office Note:    Date:  11/11/2021   ID:  Tim Finley, DOB July 31, 1951, MRN [id number]  PCP:  Manfred Shirts, PA  Cardiologist:  None  Electrophysiologist:  None   Referring MD: Manfred Shirts, PA   " I am doing okay shortness of breath has not changed"   History of Present Illness:    Tim Finley is a 71 y.o. male with a hx of coronary artery disease with minimal elevated calcium score he did not tolerate Crestor due to hyperglycemia, he has been placed on pravastatin, hypertension has not tolerated multiple medications including lisinopril, amlodipine, hydrochlorothiazide, olmesartan as well as labetalol and clonidine, diabetes mellitus, chronic kidney disease and hyperlipidemia.  The patient usually follows with Dr. Stanford Breed and was last seen by him on August 15, 2021 at that time he appeared to be at his baseline.  He had some bilateral leg soreness which he attributed to the labetalol and this medication was stopped and he was placed on clonidine.  He has since stopped the clonidine because he tells me that he is having blurry vision. We got a call from the patient pulmonologist office today reporting that he was experiencing increased heart rate and requested that the patient be seen today.  He is here today to be seen for an urgent visit.  He tells me today since he saw Dr. Stanford Breed he has had episodes of significant gout flare, he stopped all the medications including the pravastatin.  He is only taking his gout medication.  He is not expressing any chest pain, shortness of breath, palpitations.  He notes that he feels when he starts to do activities on his heart rate goes up.  But he is not quite active.  Past Medical History:  Diagnosis Date   Chronic kidney disease    Diabetes mellitus (HCC)    Diverticulosis    GERD (gastroesophageal reflux disease)    Gout    Hyperlipidemia    Hypertension    OSA (obstructive sleep apnea)    Palpitations    Squamous  cell skin cancer, thigh     Past Surgical History:  Procedure Laterality Date   APPENDECTOMY     KNEE SURGERY     TONSILLECTOMY      Current Medications: Current Meds  Medication Sig   allopurinol (ZYLOPRIM) 100 MG tablet Take 1 tablet (100 mg total) by mouth 2 (two) times daily.   cloNIDine (CATAPRES) 0.1 MG tablet Take 1 tablet (0.1 mg total) by mouth daily.   Colchicine 0.6 MG CAPS Take 0.6 mg by mouth 2 (two) times daily as needed.   famotidine (PEPCID) 20 MG tablet Take 1 tablet (20 mg total) by mouth 2 (two) times daily for 7 days.   febuxostat (ULORIC) 40 MG tablet Take 1 tablet (40 mg total) by mouth daily.   fluticasone (FLONASE) 50 MCG/ACT nasal spray Place 1 spray into the nose as directed.   insulin isophane & regular human KwikPen (NOVOLIN 70/30 KWIKPEN) (70-30) 100 UNIT/ML KwikPen 2 times per day 40 units and 50 units   Lancets (FREESTYLE) lancets 2 per day   loratadine (CLARITIN) 10 MG tablet Take 10 mg by mouth as directed.   meloxicam (MOBIC) 7.5 MG tablet Take 15 mg by mouth as directed.   metFORMIN (GLUCOPHAGE) 500 MG tablet Take 500 mg by mouth in the morning and at bedtime.   mometasone (ELOCON) 0.1 % cream APPLY TOPICALLY TO THE AFFECTED AREA TWICE A DAY FOR  UP TO 5 DAYS AS NEEDED FOR ITCHING   pravastatin (PRAVACHOL) 40 MG tablet Take 1 tablet 3x weekly   ramipril (ALTACE) 10 MG capsule Take 10 mg by mouth daily.     Allergies:   Insulin detemir, Liraglutide, Metoprolol, Glipizide, Lisinopril, and Olmesartan   Social History   Socioeconomic History   Marital status: Married    Spouse name: Not on file   Number of children: 1   Years of education: Not on file   Highest education level: Not on file  Occupational History   Not on file  Tobacco Use   Smoking status: Former    Types: Cigarettes   Smokeless tobacco: Former    Quit date: 08/15/1994  Substance and Sexual Activity   Alcohol use: Not Currently   Drug use: Never   Sexual activity: Yes     Partners: Female  Other Topics Concern   Not on file  Social History Narrative   Not on file   Social Determinants of Health   Financial Resource Strain: Not on file  Food Insecurity: Not on file  Transportation Needs: Not on file  Physical Activity: Not on file  Stress: Not on file  Social Connections: Not on file     Family History: The patient's family history includes Heart attack in his father.  ROS:   Review of Systems  Constitution: Negative for decreased appetite, fever and weight gain.  HENT: Negative for congestion, ear discharge, hoarse voice and sore throat.   Eyes: Negative for discharge, redness, vision loss in right eye and visual halos.  Cardiovascular: Negative for chest pain, dyspnea on exertion, leg swelling, orthopnea and palpitations.  Respiratory: Negative for cough, hemoptysis, shortness of breath and snoring.   Endocrine: Negative for heat intolerance and polyphagia.  Hematologic/Lymphatic: Negative for bleeding problem. Does not bruise/bleed easily.  Skin: Negative for flushing, nail changes, rash and suspicious lesions.  Musculoskeletal: Negative for arthritis, joint pain, muscle cramps, myalgias, neck pain and stiffness.  Gastrointestinal: Negative for abdominal pain, bowel incontinence, diarrhea and excessive appetite.  Genitourinary: Negative for decreased libido, genital sores and incomplete emptying.  Neurological: Negative for brief paralysis, focal weakness, headaches and loss of balance.  Psychiatric/Behavioral: Negative for altered mental status, depression and suicidal ideas.  Allergic/Immunologic: Negative for HIV exposure and persistent infections.    EKGs/Labs/Other Studies Reviewed:    The following studies were reviewed today:   EKG:  The ekg ordered today demonstrates   TTE 09/05/2021 IMPRESSIONS     1. Left ventricular ejection fraction, by estimation, is 60 to 65%. The  left ventricle has normal function. The left ventricle  has no regional  wall motion abnormalities. Left ventricular diastolic parameters are  consistent with Grade I diastolic  dysfunction (impaired relaxation).   2. Right ventricular systolic function is normal. The right ventricular  size is normal.   3. No evidence of mitral valve regurgitation. No evidence of mitral  stenosis.   4. The aortic valve is normal in structure. Aortic valve regurgitation is  not visualized. No aortic stenosis is present.   5. There is mild dilatation of the ascending aorta, measuring 38 mm.   6. The inferior vena cava is normal in size with greater than 50%  respiratory variability, suggesting right atrial pressure of 3 mmHg.   FINDINGS   Left Ventricle: Left ventricular ejection fraction, by estimation, is 60  to 65%. The left ventricle has normal function. The left ventricle has no  regional wall motion abnormalities. The  left ventricular internal cavity  size was normal in size. There is   no left ventricular hypertrophy. Left ventricular diastolic parameters  are consistent with Grade I diastolic dysfunction (impaired relaxation).   Right Ventricle: The right ventricular size is normal. No increase in  right ventricular wall thickness. Right ventricular systolic function is  normal.   Left Atrium: Left atrial size was normal in size.   Right Atrium: Right atrial size was normal in size.   Pericardium: There is no evidence of pericardial effusion.   Mitral Valve: The mitral valve is degenerative in appearance. No evidence  of mitral valve regurgitation. No evidence of mitral valve stenosis.   Tricuspid Valve: The tricuspid valve is normal in structure. Tricuspid  valve regurgitation is not demonstrated. No evidence of tricuspid  stenosis.   Aortic Valve: The aortic valve is normal in structure. Aortic valve  regurgitation is not visualized. No aortic stenosis is present. Aortic  valve mean gradient measures 4.0 mmHg. Aortic valve peak gradient  measures  6.9 mmHg. Aortic valve area, by VTI  measures 2.25 cm.   Pulmonic Valve: The pulmonic valve was normal in structure. Pulmonic valve  regurgitation is not visualized. No evidence of pulmonic stenosis.   Aorta: The aortic root is normal in size and structure. There is mild  dilatation of the ascending aorta, measuring 38 mm.   Venous: The inferior vena cava is normal in size with greater than 50%  respiratory variability, suggesting right atrial pressure of 3 mmHg.   IAS/Shunts: No atrial level shunt detected by color flow Doppler.       Recent Labs: 09/09/2021: BUN 25; Creatinine, Ser 1.44; Hemoglobin 15.9; Platelets 259; Potassium 4.4; Sodium 138  Recent Lipid Panel No results found for: CHOL, TRIG, HDL, CHOLHDL, VLDL, LDLCALC, LDLDIRECT  Physical Exam:    VS:  BP 120/80 (BP Location: Left Arm, Patient Position: Sitting, Cuff Size: Normal)    Pulse (!) 133    Ht 6\' 1"  (1.854 m)    Wt 227 lb 12.8 oz (103.3 kg)    SpO2 95%    BMI 30.05 kg/m     Wt Readings from Last 3 Encounters:  11/10/21 227 lb 12.8 oz (103.3 kg)  09/09/21 236 lb 15.9 oz (107.5 kg)  08/15/21 237 lb (107.5 kg)     GEN: Well nourished, well developed in no acute distress HEENT: Normal NECK: No JVD; No carotid bruits LYMPHATICS: No lymphadenopathy CARDIAC: S1S2 noted,RRR, no murmurs, rubs, gallops RESPIRATORY:  Clear to auscultation without rales, wheezing or rhonchi  ABDOMEN: Soft, non-tender, non-distended, +bowel sounds, no guarding. EXTREMITIES: No edema, No cyanosis, no clubbing MUSCULOSKELETAL:  No deformity  SKIN: Warm and dry NEUROLOGIC:  Alert and oriented x 3, non-focal PSYCHIATRIC:  Normal affect, good insight  ASSESSMENT:    1. Sinus tachycardia   2. Type 2 diabetes mellitus with complication, without long-term current use of insulin ([place])   3. Primary hypertension   4. Mild CAD   5. RBBB    PLAN:    His EKG today showed evidence of sinus tachycardia heart rate 124 bpm I do  believe that part of his underlying reason for his sinus tachycardia is deconditioning and pain from his gout.  We will talk really about what it means to be in sinus tachycardia and mostly once we are able to get the underlying cause adjusted that will help.  Thankfully he is asymptomatic and blood pressures normal for now.   I reviewed his blood work which  was done recently on October 27, 2021 they are all within normal limits.  No need for any repeat labs.  He plans to see rheumatology on February 6 to help with management of his gout.  The patient is in agreement with the above plan. The patient left the office in stable condition.  The patient will follow up with Dr. Stanford Breed as scheduled.   Medication Adjustments/Labs and Tests Ordered: Current medicines are reviewed at length with the patient today.  Concerns regarding medicines are outlined above.  Orders Placed This Encounter  Procedures   EKG 12-Lead   No orders of the defined types were placed in this encounter.   Patient Instructions  Medication Instructions:  Your physician recommends that you continue on your current medications as directed. Please refer to the Current Medication list given to you today.  *If you need a refill on your cardiac medications before your next appointment, please call your pharmacy*   Lab Work: None If you have labs (blood work) drawn today and your tests are completely normal, you will receive your results only by: [place] (if you have MyChart) OR A paper copy in the mail If you have any lab test that is abnormal or we need to change your treatment, we will call you to review the results.   Testing/Procedures: None   Follow-Up: At [facility], you and your health needs are our priority.  As part of our continuing mission to provide you with exceptional heart care, we have created designated Provider Care Teams.  These Care Teams include your primary Cardiologist (physician)  and Advanced Practice Providers (APPs -  Physician Assistants and Nurse Practitioners) who all work together to provide you with the care you need, when you need it.  We recommend signing up for the patient portal called "MyChart".  Sign up information is provided on this After Visit Summary.  MyChart is used to connect with patients for Virtual Visits (Telemedicine).  Patients are able to view lab/test results, encounter notes, upcoming appointments, etc.  Non-urgent messages can be sent to your provider as well.   To learn more about what you can do with MyChart, go to [email or web address].    Your next appointment:   Feb 22 at 8:40am in [facility]  The format for your next appointment:   In Person  Provider:   Kirk Ruths, MD    Other Instructions    Adopting a Healthy Lifestyle.  Know what a healthy weight is for you (roughly BMI <25) and aim to maintain this   Aim for 7+ servings of fruits and vegetables daily   65-80+ fluid ounces of water or unsweet tea for healthy kidneys   Limit to max 1 drink of alcohol per day; avoid smoking/tobacco   Limit animal fats in diet for cholesterol and heart health - choose grass fed whenever available   Avoid highly processed foods, and foods high in saturated/trans fats   Aim for low stress - take time to unwind and care for your mental health   Aim for 150 min of moderate intensity exercise weekly for heart health, and weights twice weekly for bone health   Aim for 7-9 hours of sleep daily   When it comes to diets, agreement about the perfect plan isnt easy to find, even among the experts. Experts at the [employer] developed an idea known as the Healthy Eating Plate. Just imagine a plate divided into logical, healthy portions.   The  emphasis is on diet quality:   Load up on vegetables and fruits - one-half of your plate: Aim for color and variety, and remember that potatoes dont count.   Go for whole  grains - one-quarter of your plate: Whole wheat, barley, wheat berries, quinoa, oats, brown rice, and foods made with them. If you want pasta, go with whole wheat pasta.   Protein power - one-quarter of your plate: Fish, chicken, beans, and nuts are all healthy, versatile protein sources. Limit red meat.   The diet, however, does go beyond the plate, offering a few other suggestions.   Use healthy plant oils, such as olive, canola, soy, corn, sunflower and peanut. Check the labels, and avoid partially hydrogenated oil, which have unhealthy trans fats.   If youre thirsty, drink water. Coffee and tea are good in moderation, but skip sugary drinks and limit milk and dairy products to one or two daily servings.   The type of carbohydrate in the diet is more important than the amount. Some sources of carbohydrates, such as vegetables, fruits, whole grains, and beans-are healthier than others.   Finally, stay active  Signed, Berniece Salines, DO  11/11/2021 9:23 AM    [facility]

## 2021-11-10 NOTE — Telephone Encounter (Signed)
STAT if HR is under 50 or over 120 (normal HR is 60-100 beats per minute)  What is your heart rate? 130-135  Do you have a log of your heart rate readings (document readings)? no  Do you have any other symptoms? No. Patient is asymptomatic

## 2021-11-10 NOTE — Patient Instructions (Addendum)
Medication Instructions:  Your physician recommends that you continue on your current medications as directed. Please refer to the Current Medication list given to you today.  *If you need a refill on your cardiac medications before your next appointment, please call your pharmacy*   Lab Work: None If you have labs (blood work) drawn today and your tests are completely normal, you will receive your results only by: [place] (if you have MyChart) OR A paper copy in the mail If you have any lab test that is abnormal or we need to change your treatment, we will call you to review the results.   Testing/Procedures: None   Follow-Up: At [facility], you and your health needs are our priority.  As part of our continuing mission to provide you with exceptional heart care, we have created designated Provider Care Teams.  These Care Teams include your primary Cardiologist (physician) and Advanced Practice Providers (APPs -  Physician Assistants and Nurse Practitioners) who all work together to provide you with the care you need, when you need it.  We recommend signing up for the patient portal called "MyChart".  Sign up information is provided on this After Visit Summary.  MyChart is used to connect with patients for Virtual Visits (Telemedicine).  Patients are able to view lab/test results, encounter notes, upcoming appointments, etc.  Non-urgent messages can be sent to your provider as well.   To learn more about what you can do with MyChart, go to [email or web address].    Your next appointment:   Feb 22 at 8:40am in [facility]  The format for your next appointment:   In Person  Provider:   Kirk Ruths, MD    Other Instructions

## 2021-11-10 NOTE — Telephone Encounter (Signed)
Tried to call pt at home number LMVM. Will talk to him when he arrives. He does not have a cell phone.

## 2021-11-20 ENCOUNTER — Encounter: Payer: Self-pay | Admitting: Orthopedic Surgery

## 2021-11-20 NOTE — Progress Notes (Signed)
Office Visit Note   Patient: Tim Finley           Date of Birth: July 02, 1951           MRN: [id number] Visit Date: 10/25/2021              Requested by: Manfred Shirts, [place],  [place] [id number] PCP: Manfred Shirts, [place]  Chief Complaint  Patient presents with   Left Foot - Follow-up      HPI: Patient is a 71 year old gentleman who presents in follow-up for gout involving the left foot and ankle.  Patient states that allopurinol 100 mg caused increased symptoms.  Colchicine 0.6 mg did not help at all.  Patient states he went to the emergency room after taking allopurinol which caused a major attack.  Patient states that the medicine caused his foot to swell and increased pain.  Patient feels this is secondary to his COVID shots.  Patient states he is not using his CPAP at this time.  Assessment & Plan: Visit Diagnoses:  1. Chronic gout of left foot due to renal impairment without tophus     Plan: Patient is scheduled for blood work follow-up with his primary care physician tomorrow he will have his uric acid checked at this time.  He states he is now on a Uloric 40 mg tablet that he cuts in half.  He feels like the Uloric is causing his blood sugar to elevate.  Follow-Up Instructions: Return if symptoms worsen or fail to improve.   Ortho Exam  Patient is alert, oriented, no adenopathy, well-dressed, normal affect, normal respiratory effort. Examination patient has tenderness around the left ankle medial malleolus there is no redness no tendinitis.  There are no tophaceous deposits no swelling.  Patient's most recent uric acid on December 1 was 8.0.  Imaging: No results found. No images are attached to the encounter.  Labs: No results found for: HGBA1C, ESRSEDRATE, CRP, LABURIC, REPTSTATUS, GRAMSTAIN, CULT, LABORGA   No results found for: ALBUMIN, PREALBUMIN, CBC  No results found for: MG No results found for: VD25OH  No results found for:  PREALBUMIN CBC EXTENDED Latest Ref Rng & Units 09/09/2021  WBC 4.0 - 10.5 K/uL 13.3(H)  RBC 4.22 - 5.81 MIL/uL 5.40  HGB 13.0 - 17.0 g/dL 15.9  HCT 39.0 - 52.0 % 46.4  PLT 150 - 400 K/uL 259  NEUTROABS 1.7 - 7.7 K/uL 9.8(H)  LYMPHSABS 0.7 - 4.0 K/uL 2.0     There is no height or weight on file to calculate BMI.  Orders:  No orders of the defined types were placed in this encounter.  No orders of the defined types were placed in this encounter.    Procedures: No procedures performed  Clinical Data: No additional findings.  ROS:  All other systems negative, except as noted in the HPI. Review of Systems  Objective: Vital Signs: There were no vitals taken for this visit.  Specialty Comments:  No specialty comments available.  PMFS History: Patient Active Problem List   Diagnosis Date Noted   Sinus tachycardia 11/10/2021   Type 2 diabetes mellitus with complication, without long-term current use of insulin ([place]) 11/10/2021   Primary hypertension 11/10/2021   Mild CAD 11/10/2021   Past Medical History:  Diagnosis Date   Chronic kidney disease    Diabetes mellitus ([place])    Diverticulosis    GERD (gastroesophageal reflux disease)    Gout    Hyperlipidemia  Hypertension    OSA (obstructive sleep apnea)    Palpitations    Squamous cell skin cancer, thigh     Family History  Problem Relation Age of Onset   Heart attack Father     Past Surgical History:  Procedure Laterality Date   APPENDECTOMY     KNEE SURGERY     TONSILLECTOMY     Social History   Occupational History   Not on file  Tobacco Use   Smoking status: Former    Types: Cigarettes   Smokeless tobacco: Former    Quit date: 08/15/1994  Substance and Sexual Activity   Alcohol use: Not Currently   Drug use: Never   Sexual activity: Yes    Partners: Female

## 2021-11-24 NOTE — Progress Notes (Signed)
HPI: FU dyspnea and palpitations.  Previously followed by Dr. Marijo File in [facility]. Stress echocardiogram October 2019 showed ejection fraction of 50% at rest that improved to 70% with exercise and no wall motion abnormalities noted. Abdominal CT November 2021 showed no abdominal aortic aneurysm; there was note of an 8 mm nodule in the right lower lobe and follow-up recommended in 6 to 12 months.  Note he has not tolerated multiple antihypertensives including carvedilol, lisinopril, amlodipine, hydrochlorothiazide, labetalol, clonidine and olmesartan.  He has had difficulties with some of these medications causing lower extremity edema.  Calcium score August 2022 36 which was 26 percentile; multiple small pulmonary nodules stable compared to November 2021.  Follow-up recommended 12 months..  Echocardiogram November 2022 showed ejection fraction 60 to 73%, grade 1 diastolic dysfunction, borderline dilatation of ascending aorta at 38 mm.  Patient was seen with sinus tachycardia in the office in January by Dr. Harriet Masson.  Also note he stopped his pravastatin as he felt it may be contributing to gout.  Since last seen, patient denies dyspnea, chest pain or syncope.  He notes elevated heart rate at times.  He states his systolic blood pressure is typically in the 140 range at home.  He continues to have difficulties with bilateral lower extremity edema intermittently as well as pain.  He is scheduled for an MRI of his left foot in March.  Current Outpatient Medications  Medication Sig Dispense Refill   insulin isophane & regular human KwikPen (NOVOLIN 70/30 KWIKPEN) (70-30) 100 UNIT/ML KwikPen 2 times per day 40 units and 50 units     metFORMIN (GLUCOPHAGE) 500 MG tablet Take 500 mg by mouth in the morning and at bedtime.     ramipril (ALTACE) 10 MG capsule Take 10 mg by mouth daily.     allopurinol (ZYLOPRIM) 100 MG tablet Take 1 tablet (100 mg total) by mouth 2 (two) times daily. (Patient not taking:  Reported on 12/07/2021) 60 tablet 3   cloNIDine (CATAPRES) 0.1 MG tablet Take 1 tablet (0.1 mg total) by mouth daily. (Patient not taking: Reported on 12/07/2021) 90 tablet 3   Colchicine 0.6 MG CAPS Take 0.6 mg by mouth 2 (two) times daily as needed. (Patient not taking: Reported on 12/07/2021) 60 capsule 1   famotidine (PEPCID) 20 MG tablet Take 1 tablet (20 mg total) by mouth 2 (two) times daily for 7 days. 14 tablet 0   febuxostat (ULORIC) 40 MG tablet Take 1 tablet (40 mg total) by mouth daily. (Patient not taking: Reported on 12/07/2021) 30 tablet 2   fluticasone (FLONASE) 50 MCG/ACT nasal spray Place 1 spray into the nose as directed. (Patient not taking: Reported on 12/07/2021)     Lancets (FREESTYLE) lancets 2 per day (Patient not taking: Reported on 12/07/2021)     loratadine (CLARITIN) 10 MG tablet Take 10 mg by mouth as directed. (Patient not taking: Reported on 12/07/2021)     meloxicam (MOBIC) 7.5 MG tablet Take 15 mg by mouth as directed. (Patient not taking: Reported on 12/07/2021)     mometasone (ELOCON) 0.1 % cream APPLY TOPICALLY TO THE AFFECTED AREA TWICE A DAY FOR UP TO 5 DAYS AS NEEDED FOR ITCHING (Patient not taking: Reported on 12/07/2021)     pravastatin (PRAVACHOL) 40 MG tablet Take 1 tablet 3x weekly (Patient not taking: Reported on 12/07/2021) 36 tablet 3   No current facility-administered medications for this visit.     Past Medical History:  Diagnosis Date  Chronic kidney disease    Diabetes mellitus (HCC)    Diverticulosis    GERD (gastroesophageal reflux disease)    Gout    Hyperlipidemia    Hypertension    OSA (obstructive sleep apnea)    Palpitations    Squamous cell skin cancer, thigh     Past Surgical History:  Procedure Laterality Date   APPENDECTOMY     KNEE SURGERY     TONSILLECTOMY      Social History   Socioeconomic History   Marital status: Married    Spouse name: Not on file   Number of children: 1   Years of education: Not on file    Highest education level: Not on file  Occupational History   Not on file  Tobacco Use   Smoking status: Former    Types: Cigarettes   Smokeless tobacco: Former    Quit date: 08/15/1994  Substance and Sexual Activity   Alcohol use: Not Currently   Drug use: Never   Sexual activity: Yes    Partners: Female  Other Topics Concern   Not on file  Social History Narrative   Not on file   Social Determinants of Health   Financial Resource Strain: Not on file  Food Insecurity: Not on file  Transportation Needs: Not on file  Physical Activity: Not on file  Stress: Not on file  Social Connections: Not on file  Intimate Partner Violence: Not on file    Family History  Problem Relation Age of Onset   Heart attack Father     ROS: no fevers or chills, productive cough, hemoptysis, dysphasia, odynophagia, melena, hematochezia, dysuria, hematuria, rash, seizure activity, orthopnea, PND, pedal edema, claudication. Remaining systems are negative.  Physical Exam: Well-developed well-nourished in no acute distress.  Skin is warm and dry.  HEENT is normal.  Neck is supple.  Chest is clear to auscultation with normal expansion.  Cardiovascular exam is regular rate and rhythm.  Abdominal exam nontender or distended. No masses palpated. Extremities show no edema. neuro grossly intact  A/P  1 hypertension-BP elevated; we discussed options today.  I would like to retry a beta-blocker as he describes his heart rate is elevated at times.  However he declines any additional medications until he has his MRI of his foot in March.  If there are no abnormalities he will consider trying a different antihypertensive.  2 lung nodule-patient will need follow-up CT August 2023.  3 coronary artery disease-based on previously minimally elevated calcium score.  I would like for him to be on a statin but he does not want any other medications at this point.  We will discuss again at future office visit and  will initiate if he is agreeable.  4 hyperlipidemia-patient states pravastatin caused gout.  Note he did not tolerate Crestor previously.  We will consider adding Zetia or referral for Repatha/Praluent in the future if he is agreeable.  Kirk Ruths, MD

## 2021-12-07 ENCOUNTER — Ambulatory Visit: Payer: PPO | Admitting: Cardiology

## 2021-12-07 ENCOUNTER — Other Ambulatory Visit: Payer: Self-pay

## 2021-12-07 ENCOUNTER — Encounter: Payer: Self-pay | Admitting: Cardiology

## 2021-12-07 VITALS — BP 160/82 | HR 101 | Ht 74.0 in | Wt 231.0 lb

## 2021-12-07 DIAGNOSIS — I2510 Atherosclerotic heart disease of native coronary artery without angina pectoris: Secondary | ICD-10-CM

## 2021-12-07 DIAGNOSIS — E7800 Pure hypercholesterolemia, unspecified: Secondary | ICD-10-CM

## 2021-12-07 DIAGNOSIS — I10 Essential (primary) hypertension: Secondary | ICD-10-CM

## 2021-12-07 NOTE — Patient Instructions (Signed)

## 2022-01-06 ENCOUNTER — Telehealth: Payer: Self-pay | Admitting: *Deleted

## 2022-01-06 NOTE — Telephone Encounter (Signed)
Spoke with pt, labs from [facility] reviewed by dr Stanford Breed and he wanted the patient to start zetia 10 mg once daily. The patient refuses to take anything right now as he is having problems with his blood sugar and foot pain. He is going to see the endocrinologist soon. ?

## 2022-04-06 ENCOUNTER — Other Ambulatory Visit: Payer: Self-pay | Admitting: *Deleted

## 2022-04-06 ENCOUNTER — Telehealth: Payer: Self-pay | Admitting: *Deleted

## 2022-04-06 DIAGNOSIS — R918 Other nonspecific abnormal finding of lung field: Secondary | ICD-10-CM

## 2022-04-06 DIAGNOSIS — Z136 Encounter for screening for cardiovascular disorders: Secondary | ICD-10-CM

## 2022-04-06 NOTE — Telephone Encounter (Signed)
Spoke with pt, about follow up CT scan for nodule seen in his lungs when he had a calcium scoring CT. He is not interested in scheduling at this time and will discuss with dr Stanford Breed at the time of his follow up appointment.

## 2022-04-24 ENCOUNTER — Other Ambulatory Visit (HOSPITAL_BASED_OUTPATIENT_CLINIC_OR_DEPARTMENT_OTHER): Payer: PPO

## 2022-05-15 ENCOUNTER — Other Ambulatory Visit (HOSPITAL_COMMUNITY): Payer: PPO

## 2022-05-25 NOTE — Progress Notes (Signed)
HPI: FU coronary calcification and hypertension.  Previously followed by Dr. Marijo File in [facility]. Stress echocardiogram October 2019 showed ejection fraction of 50% at rest that improved to 70% with exercise and no wall motion abnormalities noted. Abdominal CT November 2021 showed no abdominal aortic aneurysm. Note he has not tolerated multiple antihypertensives including carvedilol, lisinopril, amlodipine, hydrochlorothiazide, labetalol, clonidine and olmesartan.  He has had difficulties with some of these medications causing lower extremity edema.  Calcium score August 2022 36 which was 26 percentile; multiple small pulmonary nodules stable compared to November 2021. Follow-up recommended 12 months..  Echocardiogram November 2022 showed ejection fraction 60 to 09%, grade 1 diastolic dysfunction, borderline dilatation of ascending aorta at 38 mm. Since last seen, he denies dyspnea, chest pain, palpitations or syncope.  Current Outpatient Medications  Medication Sig Dispense Refill   Continuous Blood Gluc Receiver (FREESTYLE LIBRE 2 READER) DEVI      Continuous Blood Gluc Sensor (FREESTYLE LIBRE 2 SENSOR) MISC      fluticasone (FLONASE) 50 MCG/ACT nasal spray Place 1 spray into the nose as directed.     insulin degludec (TRESIBA FLEXTOUCH) 100 UNIT/ML FlexTouch Pen Inject 64 Units into the skin at bedtime.     insulin isophane & regular human KwikPen (NOVOLIN 70/30 KWIKPEN) (70-30) 100 UNIT/ML KwikPen 22 Units 2 (two) times daily with a meal.     loratadine (CLARITIN) 10 MG tablet Take 10 mg by mouth as directed.     mometasone (ELOCON) 0.1 % cream      ramipril (ALTACE) 10 MG capsule Take 10 mg by mouth daily.     No current facility-administered medications for this visit.     Past Medical History:  Diagnosis Date   Chronic kidney disease    Diabetes mellitus (HCC)    Diverticulosis    GERD (gastroesophageal reflux disease)    Gout    Hyperlipidemia    Hypertension    OSA  (obstructive sleep apnea)    Palpitations    Squamous cell skin cancer, thigh     Past Surgical History:  Procedure Laterality Date   APPENDECTOMY     KNEE SURGERY     TONSILLECTOMY      Social History   Socioeconomic History   Marital status: Married    Spouse name: Not on file   Number of children: 1   Years of education: Not on file   Highest education level: Not on file  Occupational History   Not on file  Tobacco Use   Smoking status: Former    Types: Cigarettes   Smokeless tobacco: Former    Quit date: 08/15/1994  Substance and Sexual Activity   Alcohol use: Not Currently   Drug use: Never   Sexual activity: Yes    Partners: Female  Other Topics Concern   Not on file  Social History Narrative   Not on file   Social Determinants of Health   Financial Resource Strain: Not on file  Food Insecurity: Not on file  Transportation Needs: Not on file  Physical Activity: Not on file  Stress: Not on file  Social Connections: Not on file  Intimate Partner Violence: Not on file    Family History  Problem Relation Age of Onset   Heart attack Father     ROS: no fevers or chills, productive cough, hemoptysis, dysphasia, odynophagia, melena, hematochezia, dysuria, hematuria, rash, seizure activity, orthopnea, PND, pedal edema, claudication. Remaining systems are negative.  Physical Exam: Well-developed well-nourished  in no acute distress.  Skin is warm and dry.  HEENT is normal.  Neck is supple.  Chest is clear to auscultation with normal expansion.  Cardiovascular exam is regular rate and rhythm.  Abdominal exam nontender or distended. No masses palpated. Extremities show no edema. neuro grossly intact  ECG-normal sinus rhythm at a rate of 84, incomplete right bundle branch block, occasional PVCs.  Personally reviewed  A/P  1 coronary artery disease-based on minimally elevated calcium score previously.  Patient declines statins.  He is not having chest  pain.  2 hypertension-blood pressure controlled.  Continue present medical regimen.  3 hyperlipidemia-patient states pravastatin previously caused gout and he did not tolerate Crestor.  He is not agreeable to other lipid-lowering medications.  4 lung nodule-patient declines fu imaging; we discussed need to make sure nodules were not enlarging but he continued to decline.  Kirk Ruths, MD

## 2022-06-07 ENCOUNTER — Ambulatory Visit: Payer: PPO | Admitting: Cardiology

## 2022-06-07 ENCOUNTER — Encounter: Payer: Self-pay | Admitting: Cardiology

## 2022-06-07 VITALS — BP 124/82 | HR 67 | Ht 74.0 in | Wt 223.0 lb

## 2022-06-07 DIAGNOSIS — I10 Essential (primary) hypertension: Secondary | ICD-10-CM | POA: Diagnosis not present

## 2022-06-07 DIAGNOSIS — E7800 Pure hypercholesterolemia, unspecified: Secondary | ICD-10-CM | POA: Diagnosis not present

## 2022-06-07 DIAGNOSIS — R918 Other nonspecific abnormal finding of lung field: Secondary | ICD-10-CM | POA: Diagnosis not present

## 2022-06-07 DIAGNOSIS — I2510 Atherosclerotic heart disease of native coronary artery without angina pectoris: Secondary | ICD-10-CM

## 2022-06-07 DIAGNOSIS — G4730 Sleep apnea, unspecified: Secondary | ICD-10-CM | POA: Insufficient documentation

## 2022-06-07 NOTE — Patient Instructions (Signed)

## 2023-06-01 NOTE — Progress Notes (Unsigned)
Cardiology Clinic Note   Patient Name: Tim Finley Date of Encounter: 06/04/2023  Primary Care Provider:  Shellia Cleverly, PA Primary Cardiologist:  Tim Millers, MD  Patient Profile    72 year old male with history of hypertension, grade 1 diastolic dysfunction and November 2022, chronic kidney disease, type 2 diabetes, hyperlipidemia, (intolerant of rosuvastatin) refuses other statin medications; pulmonary nodules, and OSA.  Last seen by Tim Finley on 06/07/2022.  Past Medical History    Past Medical History:  Diagnosis Date   Chronic kidney disease    Diabetes mellitus (HCC)    Diverticulosis    GERD (gastroesophageal reflux disease)    Gout    Hyperlipidemia    Hypertension    OSA (obstructive sleep apnea)    Palpitations    Squamous cell skin cancer, thigh    Past Surgical History:  Procedure Laterality Date   APPENDECTOMY     KNEE SURGERY     TONSILLECTOMY      Allergies  Allergies  Allergen Reactions   Insulin Detemir Other (See Comments)    Tingling of Mouth    Liraglutide Other (See Comments)    Numbness     Metoprolol Other (See Comments)    Hypersomnia with associated Increased Fatigue    Glipizide     Other reaction(s): GI Upset (intolerance) GI bleed? Anemia   Lisinopril Swelling    Ankle swelling and joint pain   Mounjaro [Tirzepatide] Hypertension   Olmesartan Other (See Comments)   Pravastatin Sodium Other (See Comments)    gout   Metformin And Related Palpitations    History of Present Illness    Mr. Squiers comes today without any cardiac complaints.  He is here for annual follow-up.  Since being seen last he has been taken off of metformin which has significantly improved lower extremity edema on the left.  He initially had been diagnosed with gout which proved to be untrue.  Once metformin was discontinued by endocrinologist he had complete resolution of lower extremity edema.  His endocrinologist is Dr. Lupita Raider FNP,  who is now taken him off of insulin injections and he is now on an insulin pump.  He states he is felt better than he has in years and has remained active.  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  He denies chest pain, dyspnea on exertion, dizziness, near-syncope, or palpitations.  He has been medically compliant.  Cardiology does not prescribe any of his medications currently as they are being followed by PCP.  He does have whitecoat syndrome, at home his blood pressures are running in the 130s over 60s.  Although this is not optimal for patient with diabetes this is much better than what we are seeing in the office.  Home Medications    Current Outpatient Medications  Medication Sig Dispense Refill   acetaminophen (TYLENOL) 500 MG tablet Take 500 mg by mouth as needed.     B Complex-Biotin-FA (VITAMIN B50 COMPLEX PO) Take 1 tablet by mouth daily in the afternoon.     Cholecalciferol (VITAMIN D3) 50 MCG (2000 UT) capsule Take 2,000 Units by mouth daily.     Continuous Blood Gluc Receiver (FREESTYLE LIBRE 2 READER) DEVI      Insulin Disposable Pump (OMNIPOD DASH PODS, GEN 4,) MISC Inject into the skin 3 (three) times daily. Inject 17 units in am 12 units at noon 18 units in the evening with  meals and 1.95 Basal each hour     loratadine (CLARITIN) 10 MG tablet Take 10 mg by mouth as directed.     ramipril (ALTACE) 10 MG capsule Take 10 mg by mouth daily.     vitamin E 1000 UNIT capsule Take 1,000 Units by mouth daily.     Continuous Blood Gluc Sensor (FREESTYLE LIBRE 2 SENSOR) MISC      fluticasone (FLONASE) 50 MCG/ACT nasal spray Place 1 spray into the nose as directed. (Patient not taking: Reported on 06/04/2023)     insulin aspart (NOVOLOG FLEXPEN) 100 UNIT/ML FlexPen Inject 22 Units into the skin 2 (two) times daily with a meal. (Patient not taking: Reported on 06/04/2023)     insulin degludec (TRESIBA FLEXTOUCH) 100 UNIT/ML  FlexTouch Pen Inject 64 Units into the skin at bedtime. (Patient not taking: Reported on 06/04/2023)     mometasone (ELOCON) 0.1 % cream  (Patient not taking: Reported on 06/04/2023)     No current facility-administered medications for this visit.     Family History    Family History  Problem Relation Age of Onset   Heart attack Father    He indicated that his mother is deceased. He indicated that his father is deceased.  Social History    Social History   Socioeconomic History   Marital status: Married    Spouse name: Not on file   Number of children: 1   Years of education: Not on file   Highest education level: Not on file  Occupational History   Not on file  Tobacco Use   Smoking status: Former    Types: Cigarettes   Smokeless tobacco: Former    Quit date: 08/15/1994  Substance and Sexual Activity   Alcohol use: Not Currently   Drug use: Never   Sexual activity: Yes    Partners: Female  Other Topics Concern   Not on file  Social History Narrative   Not on file   Social Determinants of Health   Financial Resource Strain: Not on file  Food Insecurity: No Food Insecurity (05/30/2022)   Received from [facility], [facility]   Hunger Vital Sign    Worried About Running Out of Food in the Last Year: Never true    Ran Out of Food in the Last Year: Never true  Transportation Needs: Not on file  Physical Activity: Not on file  Stress: Not on file  Social Connections: Unknown (02/28/2022)   Received from [facility], [facility]   Social Network    Social Network: Not on file  Intimate Partner Violence: Unknown (01/20/2022)   Received from [facility], [facility]   HITS    Physically Hurt: Not on file    Insult or Talk Down To: Not on file    Threaten Physical Harm: Not on file    Scream or Curse: Not on file     Review of Systems    General:  No chills, fever, night sweats or weight changes.  Cardiovascular:  No chest pain, dyspnea on exertion,  edema, orthopnea, palpitations, paroxysmal nocturnal dyspnea. Dermatological: No rash, lesions/masses Respiratory: No cough, dyspnea Urologic: No hematuria, dysuria Abdominal:   No nausea, vomiting, diarrhea, bright red blood per rectum, melena, or hematemesis Neurologic:  No visual changes, wkns, changes in mental status. All other systems reviewed and are otherwise negative except as noted above.  EKG Interpretation Date/Time:  Monday June 04 2023 13:16:40 EDT Ventricular Rate:  88 PR Interval:  176 QRS Duration:  134 QT Interval:  394 QTC Calculation: 476 R Axis:   53  Text Interpretation: Normal sinus rhythm Right bundle branch block When compared with ECG of 05-Nov-2021 15:07, No significant change was found Confirmed by Tim Finley [phone]) on 06/04/2023 1:42:39 PM    Physical Exam    VS:  BP (!) 154/84 (BP Location: Left Arm, Patient Position: Sitting, Cuff Size: Large)   Pulse 88   Ht 6\' 1"  (1.854 m)   Wt 233 lb (105.7 kg)   SpO2 93%   BMI 30.74 kg/m  , BMI Body mass index is 30.74 kg/m.     GEN: Well nourished, well developed, in no acute distress.  Central obesity is noted HEENT: normal. Neck: Supple, no JVD, carotid bruits, or masses. Cardiac: RRR, no murmurs, rubs, or gallops. No clubbing, cyanosis, edema.  Radials/DP/PT 2+ and equal bilaterally.  Respiratory:  Respirations regular and unlabored, clear to auscultation bilaterally. GI: Soft, nontender, nondistended, BS + x 4. MS: no deformity or atrophy. Skin: warm and dry, no rash. Neuro:  Strength and sensation are intact. Psych: Normal affect.  EKG Interpretation Date/Time:  Monday June 04 2023 13:16:40 EDT Ventricular Rate:  88 PR Interval:  176 QRS Duration:  134 QT Interval:  394 QTC Calculation: 476 R Axis:   53  Text Interpretation: Normal sinus rhythm Right bundle branch block When compared with ECG of 05-Nov-2021 15:07, No significant change was found Confirmed by Tim Finley  [phone]) on 06/04/2023 1:42:39 PM   Lab Results  Component Value Date   WBC 13.3 (H) 09/09/2021   HGB 15.9 09/09/2021   HCT 46.4 09/09/2021   MCV 85.9 09/09/2021   PLT 259 09/09/2021   Lab Results  Component Value Date   CREATININE 1.44 (H) 09/09/2021   BUN 25 (H) 09/09/2021   NA 138 09/09/2021   K 4.4 09/09/2021   CL 103 09/09/2021   CO2 24 09/09/2021   No results found for: "ALT", "AST", "GGT", "ALKPHOS", "BILITOT" No results found for: "CHOL", "HDL", "LDLCALC", "LDLDIRECT", "TRIG", "CHOLHDL"  No results found for: "HGBA1C"   Review of Prior Studies EKG Interpretation Date/Time:  Monday June 04 2023 13:16:40 EDT Ventricular Rate:  88 PR Interval:  176 QRS Duration:  134 QT Interval:  394 QTC Calculation: 476 R Axis:   53  Text Interpretation: Normal sinus rhythm Right bundle branch block When compared with ECG of 05-Nov-2021 15:07, No significant change was found Confirmed by Tim Finley [phone]) on 06/04/2023 1:42:39 PM    Echocardiogram 09/05/2021 1. Left ventricular ejection fraction, by estimation, is 60 to 65%. The  left ventricle has normal function. The left ventricle has no regional  wall motion abnormalities. Left ventricular diastolic parameters are  consistent with Grade I diastolic  dysfunction (impaired relaxation).   2. Right ventricular systolic function is normal. The right ventricular  size is normal.   3. No evidence of mitral valve regurgitation. No evidence of mitral  stenosis.   4. The aortic valve is normal in structure. Aortic valve regurgitation is  not visualized. No aortic stenosis is present.   5. There is mild dilatation of the ascending aorta, measuring 38 mm.   6. The inferior vena cava is normal in size with greater than 50%  respiratory variability, suggesting right atrial pressure of 3 mmHg.    Assessment & Plan   1.  Hypertension: The patient's blood pressure is elevated on office visits to providers.  At home he is  recording blood pressures in the  130s over 60s.  He is on ramipril which is provided to him by primary care.  Continue ramipril 10 mg daily.  Labs are also being followed by primary care which have been completed last week.  These are through [facility].  On review of labs patient's creatinine was 1.32.   2.  Nonobstructive CAD: Continue risk factor modification with low-cholesterol diet, purposeful exercise, blood pressure control, and weight loss.  Multiple cardiovascular risk factors to include hypertension, insulin-dependent diabetes, hyperlipidemia, and obesity.  3.  Hyperlipidemia: He is currently not on statin therapy.  Most recent labs revealed LDL of 105, total cholesterol 168, triglycerides 157.  For cardioprotective measures would recommend that the patient be placed on low-dose statin therapy.  Labs are followed by PCP , if LDL remains greater then 100 consistently may need to consider additional medications for cardio protection.  4.  Insulin-dependent diabetes: Now on insulin pump, does not tolerate metformin due to lower extremity edema.  Followed by endocrinology.         Signed, Bettey Mare. Liborio Nixon, ANP, AACC   06/04/2023 2:15 PM      Office [phone] Fax [phone]  Notice: This dictation was prepared with Dragon dictation along with smaller phrase technology. Any transcriptional errors that result from this process are unintentional and may not be corrected upon review.

## 2023-06-04 ENCOUNTER — Encounter: Payer: Self-pay | Admitting: Adult Health

## 2023-06-04 ENCOUNTER — Ambulatory Visit: Payer: PPO | Admitting: Adult Health

## 2023-06-04 VITALS — BP 154/84 | HR 88 | Ht 73.0 in | Wt 233.0 lb

## 2023-06-04 DIAGNOSIS — I10 Essential (primary) hypertension: Secondary | ICD-10-CM | POA: Diagnosis not present

## 2023-06-04 DIAGNOSIS — E7800 Pure hypercholesterolemia, unspecified: Secondary | ICD-10-CM

## 2023-06-04 DIAGNOSIS — I2510 Atherosclerotic heart disease of native coronary artery without angina pectoris: Secondary | ICD-10-CM | POA: Diagnosis not present

## 2023-06-04 DIAGNOSIS — E118 Type 2 diabetes mellitus with unspecified complications: Secondary | ICD-10-CM

## 2023-06-04 NOTE — Patient Instructions (Signed)
Medication Instructions:  No Changes *If you need a refill on your cardiac medications before your next appointment, please call your pharmacy*   Lab Work: No Labs If you have labs (blood work) drawn today and your tests are completely normal, you will receive your results only by: MyChart Message (if you have MyChart) OR A paper copy in the mail If you have any lab test that is abnormal or we need to change your treatment, we will call you to review the results.   Testing/Procedures: No Testing   Follow-Up: At [facility], you and your health needs are our priority.  As part of our continuing mission to provide you with exceptional heart care, we have created designated Provider Care Teams.  These Care Teams include your primary Cardiologist (physician) and Advanced Practice Providers (APPs -  Physician Assistants and Nurse Practitioners) who all work together to provide you with the care you need, when you need it.  We recommend signing up for the patient portal called "MyChart".  Sign up information is provided on this After Visit Summary.  MyChart is used to connect with patients for Virtual Visits (Telemedicine).  Patients are able to view lab/test results, encounter notes, upcoming appointments, etc.  Non-urgent messages can be sent to your provider as well.   To learn more about what you can do with MyChart, go to [email or web address].    Your next appointment:   1 year(s)  Provider:   Brian Crenshaw, MD
